# Patient Record
Sex: Female | Born: 1985
Health system: Southern US, Community
[De-identification: ages and names within clinical notes are randomized; demographics above are authoritative.]

## PROBLEM LIST (undated history)

## (undated) DIAGNOSIS — M1990 Unspecified osteoarthritis, unspecified site: Secondary | ICD-10-CM

## (undated) DIAGNOSIS — F329 Major depressive disorder, single episode, unspecified: Secondary | ICD-10-CM

## (undated) DIAGNOSIS — F32A Depression, unspecified: Secondary | ICD-10-CM

## (undated) DIAGNOSIS — R51 Headache: Secondary | ICD-10-CM

## (undated) DIAGNOSIS — F419 Anxiety disorder, unspecified: Secondary | ICD-10-CM

## (undated) DIAGNOSIS — R519 Headache, unspecified: Secondary | ICD-10-CM

## (undated) HISTORY — PX: KNEE SURGERY: SHX244

## (undated) HISTORY — PX: WISDOM TOOTH EXTRACTION: SHX21

---
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
## 2007-02-22 ENCOUNTER — Emergency Department (HOSPITAL_COMMUNITY): Admission: EM | Admit: 2007-02-22 | Discharge: 2007-02-22 | Payer: Self-pay | Admitting: Emergency Medicine

## 2007-06-14 ENCOUNTER — Ambulatory Visit (HOSPITAL_COMMUNITY): Admission: RE | Admit: 2007-06-14 | Discharge: 2007-06-14 | Payer: Self-pay | Admitting: Orthopedic Surgery

## 2011-07-21 ENCOUNTER — Emergency Department (HOSPITAL_COMMUNITY)
Admission: EM | Admit: 2011-07-21 | Discharge: 2011-07-22 | Disposition: A | Discharge status: Home / Self Care | Payer: 59 | Attending: Emergency Medicine | Admitting: Emergency Medicine

## 2011-07-21 ENCOUNTER — Encounter (HOSPITAL_COMMUNITY): Payer: Self-pay | Admitting: *Deleted

## 2011-07-21 DIAGNOSIS — E860 Dehydration: Secondary | ICD-10-CM | POA: Insufficient documentation

## 2011-07-21 DIAGNOSIS — R000 Tachycardia, unspecified: Secondary | ICD-10-CM | POA: Insufficient documentation

## 2011-07-21 DIAGNOSIS — R112 Nausea with vomiting, unspecified: Secondary | ICD-10-CM | POA: Insufficient documentation

## 2011-07-21 DIAGNOSIS — R197 Diarrhea, unspecified: Secondary | ICD-10-CM

## 2011-07-21 MED ORDER — SODIUM CHLORIDE 0.9 % IV BOLUS (SEPSIS)
1000.0000 mL | Freq: Once | INTRAVENOUS | Status: AC
  Administered 2011-07-22: 1000 mL via INTRAVENOUS

## 2011-07-21 MED ORDER — ONDANSETRON HCL 4 MG/2ML IJ SOLN
4.0000 mg | Freq: Once | INTRAMUSCULAR | Status: AC
  Administered 2011-07-22: 4 mg via INTRAVENOUS
  Filled 2011-07-21: qty 2

## 2011-07-21 NOTE — ED Notes (Signed)
Pt states that she has been experiencing N/V/D x 1 day.  Pt also c/o abdominal cramping.

## 2011-07-21 NOTE — ED Provider Notes (Signed)
History     CSN: [id number]  Arrival date & time 07/21/11  2122   First MD Initiated Contact with Patient 07/21/11 2257      Chief Complaint  Patient presents with  . Nausea    (Consider location/radiation/quality/duration/timing/severity/associated sxs/prior treatment) HPI Comments: PT is a 26 y/o female with hx of n/v/d that started today - actue in osnet, intermittent, 6 episodes of watery diarrhea which is non bloody and 3 episodes of vomiting.  Nothing makes better or worse = no associated f/c sob / back pain / cp.  She does have mild abd cramping.  She denies dysuria, has had no medications prior to arrival and does note that she has a sick contact in a sister-in-law who has similar symptoms. She has not been able to tolerate fluids today.  The history is provided by the patient and a relative.    History reviewed. No pertinent past medical history.  History reviewed. No pertinent past surgical history.  No family history on file.  History  Substance Use Topics  . Smoking status: Not on file  . Smokeless tobacco: Not on file  . Alcohol Use: No    OB History    Grav Para Term Preterm Abortions TAB SAB Ect Mult Living                  Review of Systems  All other systems reviewed and are negative.    Allergies  Doxycycline  Home Medications   Current Outpatient Rx  Name Route Sig Dispense Refill  . IMIPRAMINE HCL 25 MG PO TABS Oral Take 25 mg by mouth at bedtime.    Marland Kitchen LEVONORGESTREL 20 MCG/24HR IU IUD Intrauterine 1 each by Intrauterine route once.    . OMEGA-3-ACID ETHYL ESTERS 1 G PO CAPS Oral Take 1 g by mouth daily.    Marland Kitchen PRENATAL MULTIVITAMIN CH Oral Take 1 tablet by mouth daily.    Marland Kitchen ONDANSETRON 4 MG PO TBDP Oral Take 1 tablet (4 mg total) by mouth every 8 (eight) hours as needed for nausea. 10 tablet 0    BP 131/91  Pulse 109  Temp(Src) 98 F (36.7 C) (Oral)  Resp 18  SpO2 99%  LMP 07/14/2011  Physical Exam  Nursing note and vitals  reviewed. Constitutional: She appears well-developed and well-nourished. No distress.  HENT:  Head: Normocephalic and atraumatic.  Mouth/Throat: No oropharyngeal exudate.       MM dry  Eyes: Conjunctivae and EOM are normal. Pupils are equal, round, and reactive to light. Right eye exhibits no discharge. Left eye exhibits no discharge. No scleral icterus.  Neck: Normal range of motion. Neck supple. No JVD present. No thyromegaly present.  Cardiovascular: Regular rhythm, normal heart sounds and intact distal pulses.  Exam reveals no gallop and no friction rub.   No murmur heard.      tachycadia to 110  Pulmonary/Chest: Effort normal and breath sounds normal. No respiratory distress. She has no wheezes. She has no rales.  Abdominal: Soft. Bowel sounds are normal. She exhibits no distension and no mass. There is tenderness (  minimal diffuse abdominal tenderness, no focal tenderness, no masses, no guarding).       Non-peritoneal  Musculoskeletal: Normal range of motion. She exhibits no edema and no tenderness.  Lymphadenopathy:    She has no cervical adenopathy.  Neurological: She is alert. Coordination normal.  Skin: Skin is warm and dry. No rash noted. No erythema.  Psychiatric: She has a normal mood  and affect. Her behavior is normal.    ED Course  Procedures (including critical care time)  Labs Reviewed - No data to display No results found.   1. Nausea vomiting and diarrhea   2. Dehydration       MDM  Dehydrated secondary to gastroenteritis - IVF rescucitation - BP normal, no signs of anemia, or focal ttp concerning for any other processes.  Patient has been given 1 L of IV fluids, Zofran for nausea and has improved significantly currently asking for fluids. Fluids trial home with Zofran prescription.       Vida Roller, MD 07/22/11 [phone]

## 2011-07-22 MED ORDER — ONDANSETRON 4 MG PO TBDP: 4.0000 mg | ORAL_TABLET | Freq: Three times a day (TID) | ORAL | Status: AC | PRN

## 2011-07-22 NOTE — Discharge Instructions (Signed)
zofran for nausea, return as needed - drink plenty of fluids over the next 24 hours.

## 2011-07-22 NOTE — ED Notes (Signed)
Pt. Tolerating coke beverage without feeling any nausea. Denies vomiting.

## 2013-12-25 ENCOUNTER — Inpatient Hospital Stay (HOSPITAL_COMMUNITY)
Admission: AD | Admit: 2013-12-25 | Discharge: 2013-12-25 | Disposition: A | Discharge status: Home / Self Care | Payer: Managed Care, Other (non HMO) | Source: Ambulatory Visit | Attending: Obstetrics & Gynecology | Admitting: Obstetrics & Gynecology

## 2013-12-25 ENCOUNTER — Inpatient Hospital Stay (HOSPITAL_COMMUNITY): Payer: Managed Care, Other (non HMO)

## 2013-12-25 ENCOUNTER — Encounter (HOSPITAL_COMMUNITY): Payer: Self-pay

## 2013-12-25 DIAGNOSIS — O209 Hemorrhage in early pregnancy, unspecified: Secondary | ICD-10-CM | POA: Insufficient documentation

## 2013-12-25 DIAGNOSIS — O360990 Maternal care for other rhesus isoimmunization, unspecified trimester, not applicable or unspecified: Secondary | ICD-10-CM | POA: Diagnosis not present

## 2013-12-25 LAB — CBC
HCT: 38.2 % (ref 36.0–46.0)
Hemoglobin: 13.7 g/dL (ref 12.0–15.0)
MCH: 29.1 pg (ref 26.0–34.0)
MCHC: 35.9 g/dL (ref 30.0–36.0)
MCV: 81.3 fL (ref 78.0–100.0)
PLATELETS: 241 10*3/uL (ref 150–400)
RBC: 4.7 MIL/uL (ref 3.87–5.11)
RDW: 12.2 % (ref 11.5–15.5)
WBC: 6.5 10*3/uL (ref 4.0–10.5)

## 2013-12-25 LAB — WET PREP, GENITAL
Clue Cells Wet Prep HPF POC: NONE SEEN
Trich, Wet Prep: NONE SEEN
Yeast Wet Prep HPF POC: NONE SEEN

## 2013-12-25 LAB — HCG, QUANTITATIVE, PREGNANCY: hCG, Beta Chain, Quant, S: 10793 m[IU]/mL — ABNORMAL HIGH (ref <5)

## 2013-12-25 LAB — ABO/RH: ABO/RH(D): O NEG

## 2013-12-25 LAB — POCT PREGNANCY, URINE: PREG TEST UR: POSITIVE — AB

## 2013-12-25 MED ORDER — RHO D IMMUNE GLOBULIN 1500 UNIT/2ML IJ SOSY
300.0000 ug | PREFILLED_SYRINGE | Freq: Once | INTRAMUSCULAR | Status: AC
  Administered 2013-12-25: 300 ug via INTRAMUSCULAR
  Filled 2013-12-25: qty 2

## 2013-12-25 NOTE — MAU Provider Note (Signed)
Reviewed case with NP and I agree with above.   Teresa Perkins [name]  

## 2013-12-25 NOTE — MAU Provider Note (Signed)
History     CSN: [id number]  Arrival date and time: 12/25/13 1531   First Provider Initiated Contact with Patient 12/25/13 1620      Chief Complaint  Patient presents with  . Vaginal Bleeding  . Abdominal Cramping   HPIpt is 4793w3d pregnant and had dark brown spotting and cramping like a period.    Pt presents complaining of vaginal bleeding like a period and abdominal cramping. States it started last night.      History reviewed. No pertinent past medical history.  Past Surgical History  Procedure Laterality Date  . Knee surgery      History reviewed. No pertinent family history.  History  Substance Use Topics  . Smoking status: Not on file  . Smokeless tobacco: Not on file  . Alcohol Use: No    Allergies:  Allergies  Allergen Reactions  . Doxycycline Nausea And Vomiting         Prescriptions prior to admission  Medication Sig Dispense Refill  . Prenatal Vit-Fe Fumarate-FA (PRENATAL MULTIVITAMIN) TABS Take 1 tablet by mouth daily.        Review of Systems  Constitutional: Negative for fever and chills.  Gastrointestinal: Positive for abdominal pain. Negative for heartburn, nausea and vomiting.  Genitourinary: Negative for dysuria and urgency.   Physical Exam   Blood pressure 122/75, pulse 69, temperature 97.8 F (36.6 C), temperature source Oral, resp. rate 69, last menstrual period 10/20/2013.  Physical Exam  Nursing note and vitals reviewed. Constitutional: She is oriented to person, place, and time. She appears well-developed and well-nourished. No distress.  HENT:  Head: Normocephalic.  Eyes: Pupils are equal, round, and reactive to light.  Neck: Normal range of motion. Neck supple.  Cardiovascular: Normal rate.   Respiratory: Effort normal.  GI: Soft.  Genitourinary:  Small amount of blood in vault; cervix clean NT; uterus NSSC NT; adnexa without palpable enlargement, NT  Musculoskeletal: Normal range of motion.  Neurological: She is alert  and oriented to person, place, and time.  Skin: Skin is warm and dry.  Psychiatric: She has a normal mood and affect.    MAU Course  Procedures Results for orders placed during the hospital encounter of 12/25/13 (from the past 24 hour(s))  POCT PREGNANCY, URINE     Status: Abnormal   Collection Time    12/25/13  4:09 PM      Result Value Ref Range   Preg Test, Ur POSITIVE (*) NEGATIVE  ABO/RH     Status: None   Collection Time    12/25/13  4:35 PM      Result Value Ref Range   ABO/RH(D) O NEG    CBC     Status: None   Collection Time    12/25/13  4:35 PM      Result Value Ref Range   WBC 6.5  4.0 - 10.5 K/uL   RBC 4.70  3.87 - 5.11 MIL/uL   Hemoglobin 13.7  12.0 - 15.0 g/dL   HCT 40.938.2  81.136.0 - 91.446.0 %   MCV 81.3  78.0 - 100.0 fL   MCH 29.1  26.0 - 34.0 pg   MCHC 35.9  30.0 - 36.0 g/dL   RDW 78.212.2  95.611.5 - 21.315.5 %   Platelets 241  150 - 400 K/uL  HCG, QUANTITATIVE, PREGNANCY     Status: Abnormal   Collection Time    12/25/13  4:35 PM      Result Value Ref Range   hCG, Beta  Nyra Jabs, [place] [address] (*) <5 mIU/mL  WET PREP, GENITAL     Status: Abnormal   Collection Time    12/25/13  4:50 PM      Result Value Ref Range   Yeast Wet Prep HPF POC NONE SEEN  NONE SEEN   Trich, Wet Prep NONE SEEN  NONE SEEN   Clue Cells Wet Prep HPF POC NONE SEEN  NONE SEEN   WBC, Wet Prep HPF POC FEW (*) NONE SEEN  US Ob Comp Less 14 Wks  12/25/2013   CLINICAL DATA:  Bleeding, cramping.  EXAM: OBSTETRIC <14 WK ULTRASOUND  TECHNIQUE: Transabdominal ultrasound was performed for evaluation of the gestation as well as the maternal uterus and adnexal regions.  COMPARISON:  None.  FINDINGS: Intrauterine gestational sac: Visualized/ somewhat irregular in shape.  Yolk sac:  Visualized  Embryo:  Soft tissue area noted, questionable fetal pole.  Cardiac Activity: None visualized  Heart Rate:  bpm  MSD: 24  Mm   7 w   4  d  CRL: If present, measures approximately 26 mm 5 w 6 d [place] EDC:  Maternal uterus/adnexae: No  subchorionic hemorrhage. No adnexal masses. Ovaries symmetric in size. No free fluid.  IMPRESSION: Somewhat irregular gestational sac noted. There is questionable early fetal pole present without Cardiac activity at this point. Given the size/date discrepancy between the gestational sac and fetal pole, no EDC was assigned. Recommend followup ultrasound in 10-14 days to assess for change and expected progression.   Electronically Signed   By: Charlett Nose M.D.   On: 12/25/2013 18:09   US Ob Transvaginal  12/25/2013   CLINICAL DATA:  Bleeding, cramping.  EXAM: OBSTETRIC <14 WK ULTRASOUND  TECHNIQUE: Transabdominal ultrasound was performed for evaluation of the gestation as well as the maternal uterus and adnexal regions.  COMPARISON:  None.  FINDINGS: Intrauterine gestational sac: Visualized/ somewhat irregular in shape.  Yolk sac:  Visualized  Embryo:  Soft tissue area noted, questionable fetal pole.  Cardiac Activity: None visualized  Heart Rate:  bpm  MSD: 24  Mm   7 w   4  d  CRL: If present, measures approximately 26 mm 5 w 6 d [place] EDC:  Maternal uterus/adnexae: No subchorionic hemorrhage. No adnexal masses. Ovaries symmetric in size. No free fluid.  IMPRESSION: Somewhat irregular gestational sac noted. There is questionable early fetal pole present without Cardiac activity at this point. Given the size/date discrepancy between the gestational sac and fetal pole, no EDC was assigned. Recommend followup ultrasound in 10-14 days to assess for change and expected progression.   Electronically Signed   By: Charlett Nose M.D.   On: 12/25/2013 18:09  Rhogam given- O neg Discussed with Dr. Mora Appl Assessment and Plan  Bleeding in pregnancy- f/u next week with Dr. Waynard Reeds Rh neg- Rhophylac given  [facility] 12/25/2013, 4:23 PM

## 2013-12-25 NOTE — MAU Note (Signed)
Pt presents complaining of vaginal bleeding like a period and abdominal cramping. States it started last night.

## 2013-12-25 NOTE — MAU Note (Signed)
Pt called out complaining of heavier vaginal bleeding. S.Lineberry at bedside to reassess bleeding

## 2013-12-26 LAB — RH IG WORKUP (INCLUDES ABO/RH)
ABO/RH(D): O NEG
ANTIBODY SCREEN: NEGATIVE
Gestational Age(Wks): 9
UNIT DIVISION: 0

## 2013-12-27 LAB — GC/CHLAMYDIA PROBE AMP
CT PROBE, AMP APTIMA: NEGATIVE
GC PROBE AMP APTIMA: NEGATIVE

## 2014-03-14 ENCOUNTER — Encounter (HOSPITAL_COMMUNITY): Payer: Self-pay

## 2014-10-30 ENCOUNTER — Encounter (HOSPITAL_COMMUNITY): Payer: Self-pay | Admitting: *Deleted

## 2015-05-14 NOTE — L&D Delivery Note (Signed)
Delivery Note Patient pushed for 1 hour after she was noted to be C/C/+2.  At 12:36 AM a viable and healthy female was delivered via Vaginal, Spontaneous Delivery (Presentation: ROA ). Tight nuchal cord x 1. Baby was delivered through nuchal cord.  APGAR: 8/9 weight is pending. Neonatal team was present at time of delivery due to deep variables with pushing and thick meconium.  Placenta was delivered spontaneously, intact with a 3-vessel  Cord.  Complications: none  Cord pH: drawn, pending  Anesthesia:  Epidural  Episiotomy:  none Lacerations: 2nd degree;Perineal Suture Repair: 2.0 vicryl Est. Blood Loss (mL):  300 mL  Mom to postpartum.  Baby to Couplet care / Skin to Skin.  Teresa Perkins [name] 03/02/2016, 1:12 AM

## 2015-08-04 LAB — OB RESULTS CONSOLE GC/CHLAMYDIA
Chlamydia: NEGATIVE
Gonorrhea: NEGATIVE

## 2015-08-28 LAB — OB RESULTS CONSOLE RUBELLA ANTIBODY, IGM: RUBELLA: IMMUNE

## 2015-08-28 LAB — OB RESULTS CONSOLE HEPATITIS B SURFACE ANTIGEN: HEP B S AG: NEGATIVE

## 2015-08-28 LAB — OB RESULTS CONSOLE RPR: RPR: NONREACTIVE

## 2015-08-28 LAB — OB RESULTS CONSOLE HIV ANTIBODY (ROUTINE TESTING): HIV: NONREACTIVE

## 2015-11-13 ENCOUNTER — Inpatient Hospital Stay (HOSPITAL_COMMUNITY)
Admission: AD | Admit: 2015-11-13 | Payer: Managed Care, Other (non HMO) | Source: Ambulatory Visit | Admitting: Obstetrics and Gynecology

## 2016-02-06 IMAGING — US US OB COMP LESS 14 WK
1 series · 14 of 28 positions shown · non-contrast
Comparison: None.

CLINICAL DATA: Bleeding, cramping.

EXAM:
OBSTETRIC <14 WK ULTRASOUND
TECHNIQUE: Transabdominal ultrasound was performed for evaluation of the
gestation as well as the maternal uterus and adnexal regions.

[Series 1: us ob comp less 14 wks · 48 acquisitions, 14 frames shown]
[im 2/48]
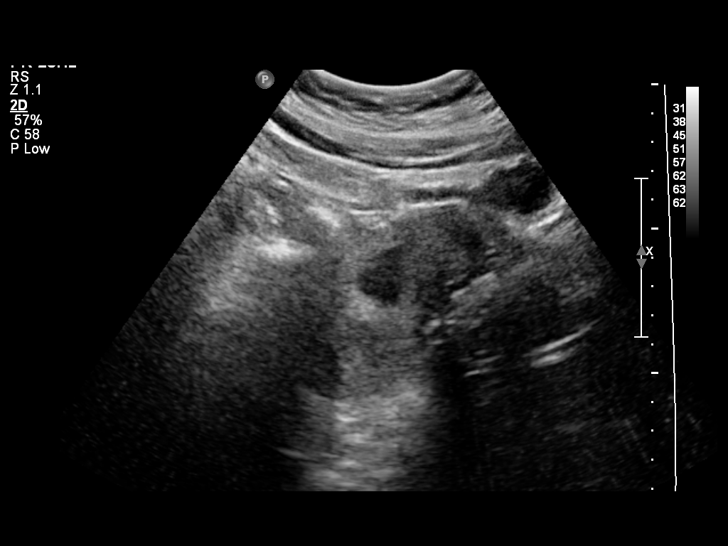
[im 6/48]
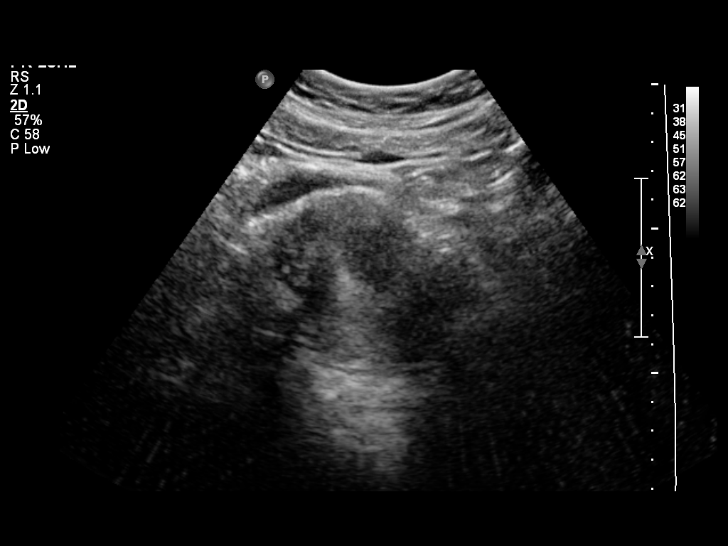
[im 9/48]
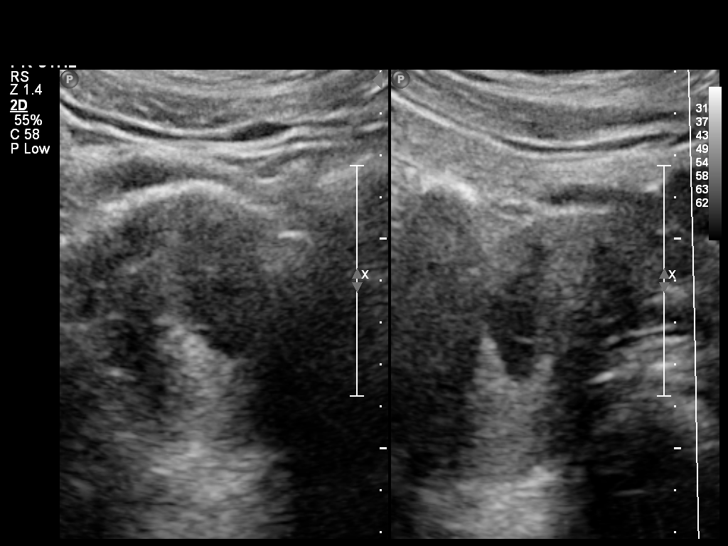
[im 13/48]
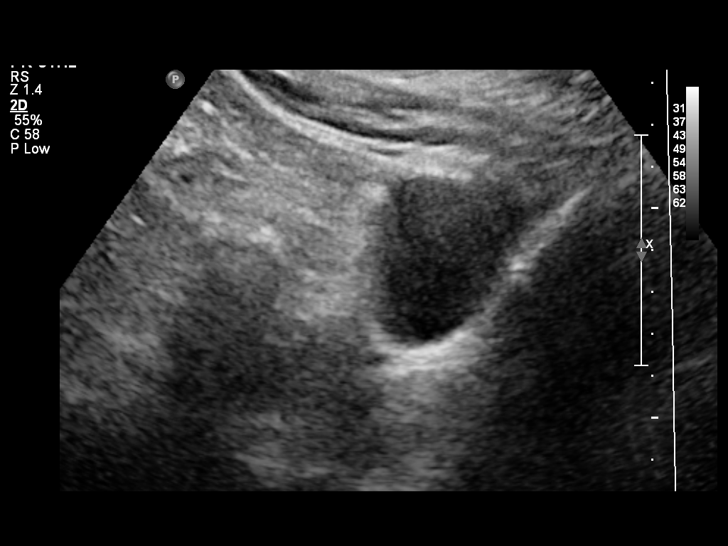
[im 16/48]
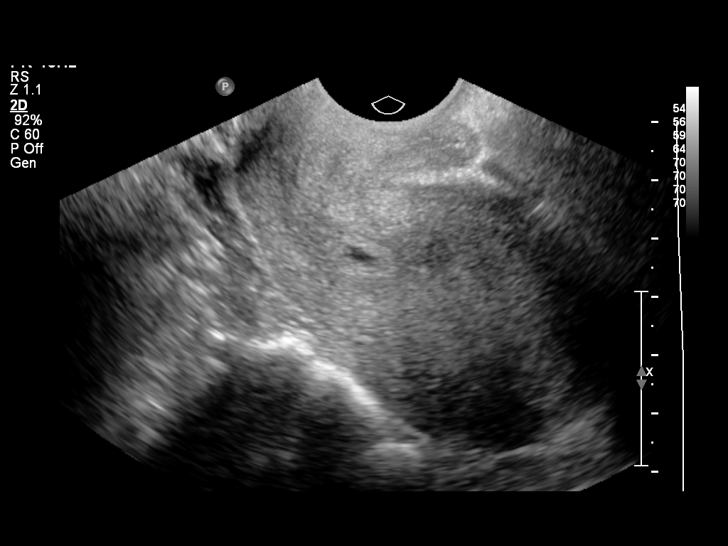
[im 20/48]
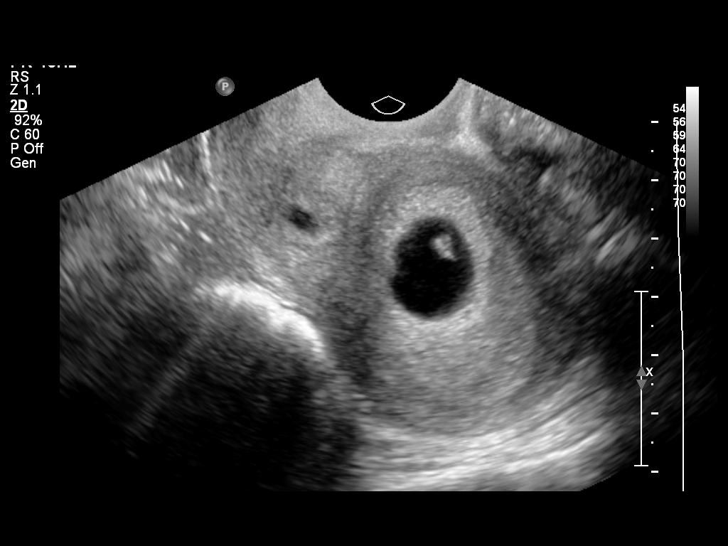
[im 23/48]
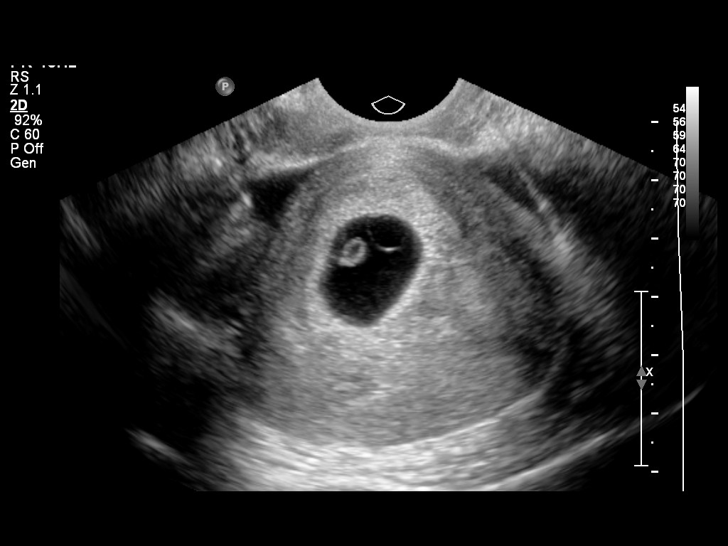
[im 27/48]
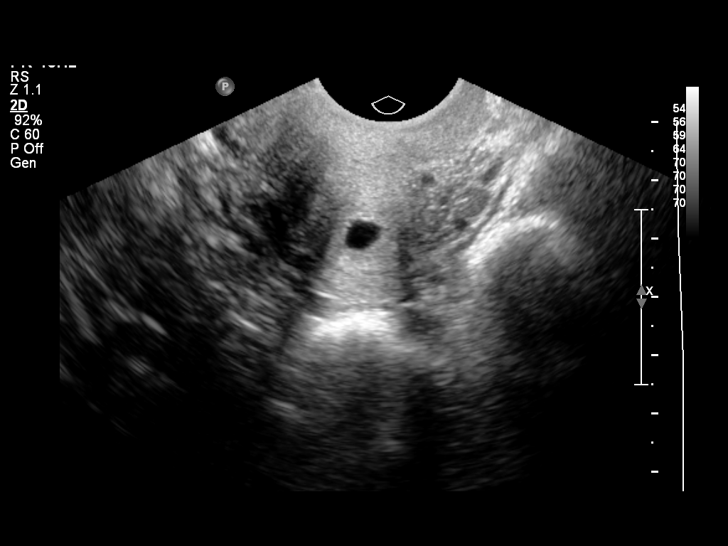
[im 30/48]
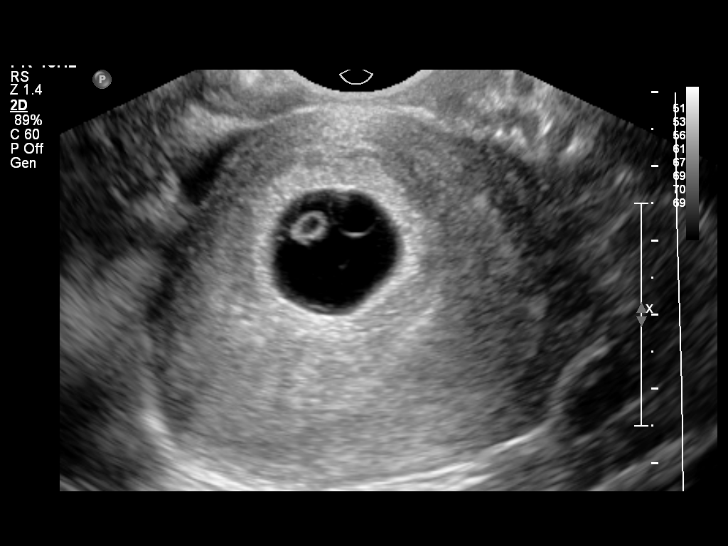
[im 34/48]
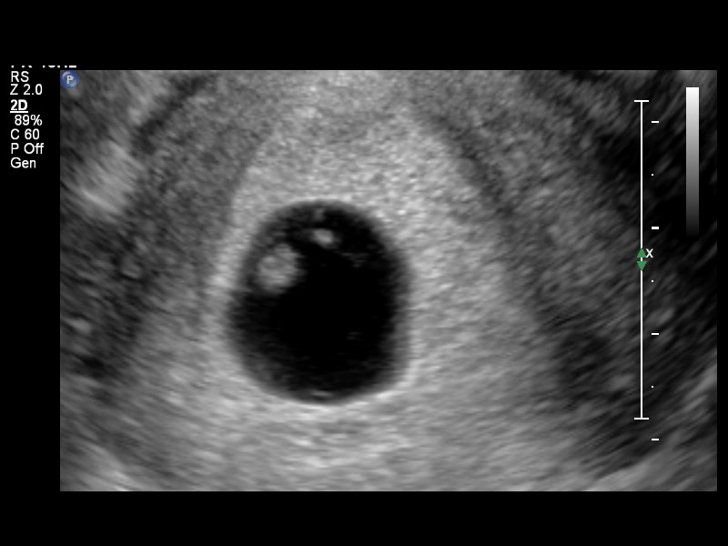
[im 37/48]
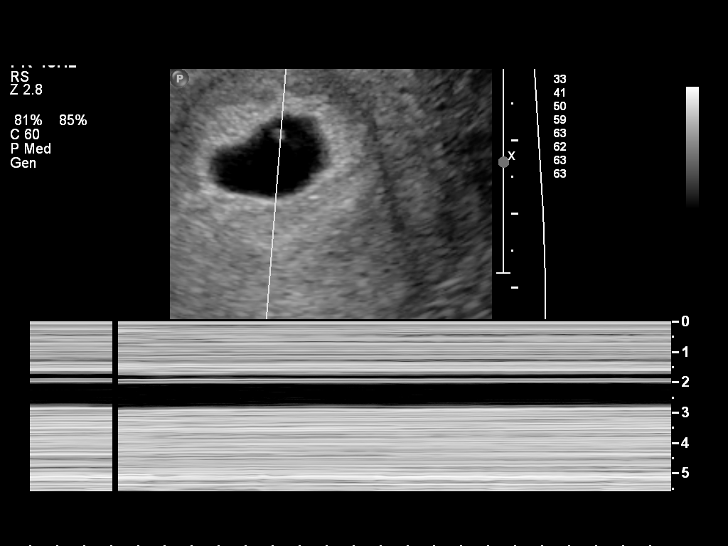
[im 41/48]
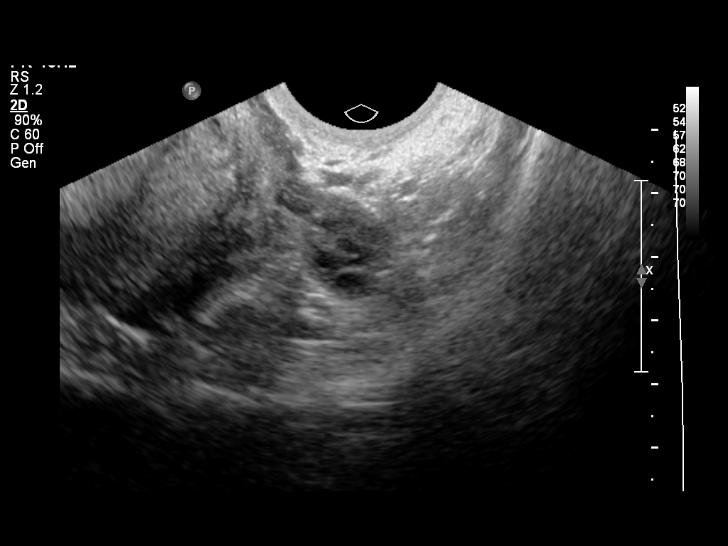
[im 44/48]
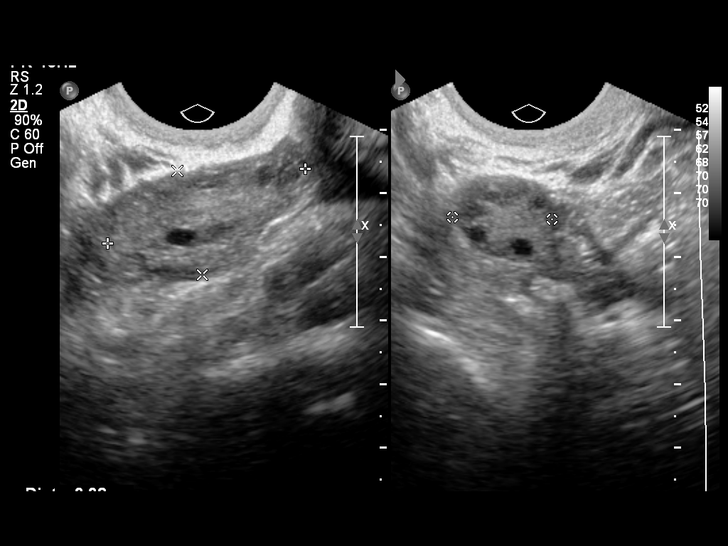
[im 48/48]
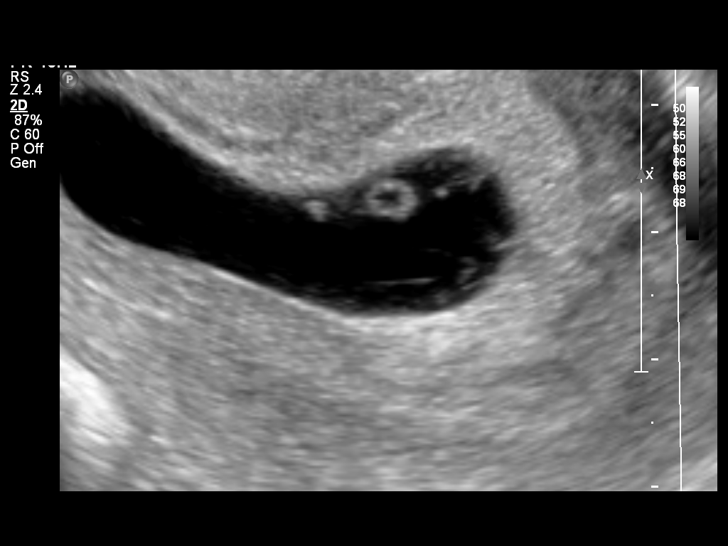

[14 of 28 positions shown; findings below may reference images not displayed]

FINDINGS: Intrauterine gestational sac: Visualized/ somewhat irregular in
shape.

Yolk sac:  Visualized

Embryo:  Soft tissue area noted, questionable fetal pole.

Cardiac Activity: None visualized

Heart Rate:  bpm

MSD: 24  Mm   7 w   4  d

CRL: If present, measures approximately 26 mm 5 w 6 d US EDC:

Maternal uterus/adnexae: No subchorionic hemorrhage. No adnexal
masses. Ovaries symmetric in size. No free fluid.
IMPRESSION: Somewhat irregular gestational sac noted. There is questionable
early fetal pole present without Cardiac activity at this point.
Given the size/date discrepancy between the gestational sac and
fetal pole, no EDC was assigned. Recommend followup ultrasound in
10-14 days to assess for change and expected progression.

## 2016-02-12 LAB — OB RESULTS CONSOLE GBS: GBS: NEGATIVE

## 2016-03-01 ENCOUNTER — Inpatient Hospital Stay (HOSPITAL_COMMUNITY)
Admission: AD | Admit: 2016-03-01 | Discharge: 2016-03-04 | DRG: 775 | Disposition: A | Discharge status: Home / Self Care | Payer: 59 | Source: Ambulatory Visit | Attending: Obstetrics & Gynecology | Admitting: Obstetrics & Gynecology

## 2016-03-01 ENCOUNTER — Encounter (HOSPITAL_COMMUNITY): Payer: Self-pay | Admitting: *Deleted

## 2016-03-01 ENCOUNTER — Inpatient Hospital Stay (HOSPITAL_COMMUNITY): Payer: 59 | Admitting: Anesthesiology

## 2016-03-01 DIAGNOSIS — Z3A38 38 weeks gestation of pregnancy: Secondary | ICD-10-CM

## 2016-03-01 DIAGNOSIS — Z3403 Encounter for supervision of normal first pregnancy, third trimester: Secondary | ICD-10-CM | POA: Diagnosis present

## 2016-03-01 HISTORY — DX: Major depressive disorder, single episode, unspecified: F32.9

## 2016-03-01 HISTORY — DX: Headache: R51

## 2016-03-01 HISTORY — DX: Headache, unspecified: R51.9

## 2016-03-01 HISTORY — DX: Unspecified osteoarthritis, unspecified site: M19.90

## 2016-03-01 HISTORY — DX: Depression, unspecified: F32.A

## 2016-03-01 HISTORY — DX: Anxiety disorder, unspecified: F41.9

## 2016-03-01 LAB — CBC
HEMATOCRIT: 36.4 % (ref 36.0–46.0)
HEMOGLOBIN: 13 g/dL (ref 12.0–15.0)
MCH: 27.7 pg (ref 26.0–34.0)
MCHC: 35.7 g/dL (ref 30.0–36.0)
MCV: 77.4 fL — ABNORMAL LOW (ref 78.0–100.0)
Platelets: 231 10*3/uL (ref 150–400)
RBC: 4.7 MIL/uL (ref 3.87–5.11)
RDW: 12.5 % (ref 11.5–15.5)
WBC: 13.6 10*3/uL — ABNORMAL HIGH (ref 4.0–10.5)

## 2016-03-01 MED ORDER — SOD CITRATE-CITRIC ACID 500-334 MG/5ML PO SOLN: 30.0000 mL | ORAL | Status: DC | PRN

## 2016-03-01 MED ORDER — EPHEDRINE 5 MG/ML INJ: 10.0000 mg | INTRAVENOUS | Status: DC | PRN

## 2016-03-01 MED ORDER — DIPHENHYDRAMINE HCL 50 MG/ML IJ SOLN: 12.5000 mg | INTRAMUSCULAR | Status: DC | PRN

## 2016-03-01 MED ORDER — ACETAMINOPHEN 325 MG PO TABS: 650.0000 mg | ORAL_TABLET | ORAL | Status: DC | PRN

## 2016-03-01 MED ORDER — OXYCODONE-ACETAMINOPHEN 5-325 MG PO TABS
1.0000 | ORAL_TABLET | ORAL | Status: DC | PRN
  Administered 2016-03-02 (×2): 1 via ORAL

## 2016-03-01 MED ORDER — LACTATED RINGERS IV SOLN
500.0000 mL | Freq: Once | INTRAVENOUS | Status: AC
  Administered 2016-03-01: 500 mL via INTRAVENOUS

## 2016-03-01 MED ORDER — OXYTOCIN 40 UNITS IN LACTATED RINGERS INFUSION - SIMPLE MED
2.5000 [IU]/h | INTRAVENOUS | Status: DC
  Filled 2016-03-01: qty 1000

## 2016-03-01 MED ORDER — FENTANYL 2.5 MCG/ML BUPIVACAINE 1/10 % EPIDURAL INFUSION (WH - ANES)
14.0000 mL/h | INTRAMUSCULAR | Status: DC | PRN
  Administered 2016-03-01: 14 mL/h via EPIDURAL
  Filled 2016-03-01: qty 125

## 2016-03-01 MED ORDER — PHENYLEPHRINE 40 MCG/ML (10ML) SYRINGE FOR IV PUSH (FOR BLOOD PRESSURE SUPPORT)
80.0000 ug | PREFILLED_SYRINGE | INTRAVENOUS | Status: DC | PRN
  Administered 2016-03-01 (×2): 80 ug via INTRAVENOUS
  Filled 2016-03-01: qty 10

## 2016-03-01 MED ORDER — LACTATED RINGERS IV SOLN: 500.0000 mL | INTRAVENOUS | Status: DC | PRN

## 2016-03-01 MED ORDER — LIDOCAINE HCL (PF) 1 % IJ SOLN
INTRAMUSCULAR | Status: DC | PRN
  Administered 2016-03-01 (×2): 5 mL

## 2016-03-01 MED ORDER — FLEET ENEMA 7-19 GM/118ML RE ENEM: 1.0000 | ENEMA | RECTAL | Status: DC | PRN

## 2016-03-01 MED ORDER — ONDANSETRON HCL 4 MG/2ML IJ SOLN
4.0000 mg | Freq: Four times a day (QID) | INTRAMUSCULAR | Status: DC | PRN
  Administered 2016-03-01: 4 mg via INTRAVENOUS
  Filled 2016-03-01: qty 2

## 2016-03-01 MED ORDER — OXYCODONE-ACETAMINOPHEN 5-325 MG PO TABS: 2.0000 | ORAL_TABLET | ORAL | Status: DC | PRN

## 2016-03-01 MED ORDER — LACTATED RINGERS IV SOLN
INTRAVENOUS | Status: DC
  Administered 2016-03-02: via INTRAVENOUS

## 2016-03-01 MED ORDER — OXYTOCIN BOLUS FROM INFUSION
500.0000 mL | Freq: Once | INTRAVENOUS | Status: AC
  Administered 2016-03-02: 500 mL via INTRAVENOUS

## 2016-03-01 MED ORDER — LIDOCAINE HCL (PF) 1 % IJ SOLN
30.0000 mL | INTRAMUSCULAR | Status: DC | PRN
  Filled 2016-03-01: qty 30

## 2016-03-01 MED ORDER — PHENYLEPHRINE 40 MCG/ML (10ML) SYRINGE FOR IV PUSH (FOR BLOOD PRESSURE SUPPORT)
80.0000 ug | PREFILLED_SYRINGE | INTRAVENOUS | Status: DC | PRN
  Filled 2016-03-01: qty 10

## 2016-03-01 NOTE — Anesthesia Procedure Notes (Signed)
Epidural Patient location during procedure: OB  Staffing Anesthesiologist: Noah Lembke Performed: anesthesiologist   Preanesthetic Checklist Completed: patient identified, site marked, surgical consent, pre-op evaluation, timeout performed, IV checked, risks and benefits discussed and monitors and equipment checked  Epidural Patient position: sitting Prep: DuraPrep Patient monitoring: heart rate, continuous pulse ox and blood pressure Approach: midline Location: L3-L4 Injection technique: LOR saline  Needle:  Needle type: Tuohy  Needle gauge: 17 G Needle length: 9 cm and 9 Needle insertion depth: 6 cm Catheter type: closed end flexible Catheter size: 20 Guage Catheter at skin depth: 10 cm Test dose: negative  Assessment Events: blood not aspirated, injection not painful, no injection resistance, negative IV test and no paresthesia  Additional Notes Patient identified. Risks/Benefits/Options discussed with patient including but not limited to bleeding, infection, nerve damage, paralysis, failed block, incomplete pain control, headache, blood pressure changes, nausea, vomiting, reactions to medication both or allergic, itching and postpartum back pain. Confirmed with bedside nurse the patient's most recent platelet count. Confirmed with patient that they are not currently taking any anticoagulation, have any bleeding history or any family history of bleeding disorders. Patient expressed understanding and wished to proceed. All questions were answered. Sterile technique was used throughout the entire procedure. Please see nursing notes for vital signs. Test dose was given through epidural needle and negative prior to continuing to dose epidural or start infusion. Warning signs of high block given to the patient including shortness of breath, tingling/numbness in hands, complete motor block, or any concerning symptoms with instructions to call for help. Patient was given instructions  on fall risk and not to get out of bed. All questions and concerns addressed with instructions to call with any issues.     

## 2016-03-01 NOTE — H&P (Signed)
Teresa Perkins is a 30 y.o. female presenting for regular painful contractions. Patient denies leaking of fluid, +bloody show, normal fetal movement.   OB History    Gravida Para Term Preterm AB Living   2       1     SAB TAB Ectopic Multiple Live Births   1             Past Medical History:  Diagnosis Date  . Anxiety   . Arthritis   . Depression   . Headache    Past Surgical History:  Procedure Laterality Date  . KNEE SURGERY    . WISDOM TOOTH EXTRACTION     Family History: family history is not on file. Social History:  reports that she has never smoked. She has never used smokeless tobacco. She reports that she does not drink alcohol or use drugs.     Maternal Diabetes: No Genetic Screening: Normal Maternal Ultrasounds/Referrals: Normal Fetal Ultrasounds or other Referrals:  Other: Anatomy scan normal Maternal Substance Abuse:  No Significant Maternal Medications:  None Significant Maternal Lab Results:  Lab values include: Group B Strep negative Other Comments:  None  Review of Systems  Constitutional: Negative for chills and fever.  Eyes: Negative for blurred vision and double vision.  Cardiovascular: Negative for chest pain.  Gastrointestinal: Negative for heartburn and nausea.  Genitourinary: Negative for dysuria and urgency.  Neurological: Negative for headaches.   Maternal Medical History:  Reason for admission: Contractions.  Nausea.  Contractions: Onset was 3-5 hours ago.   Frequency: regular.   Duration is approximately 60 seconds.   Perceived severity is strong.    Fetal activity: Perceived fetal activity is normal.   Last perceived fetal movement was within the past 12 hours.    Prenatal complications: no prenatal complications Prenatal Complications - Diabetes: none.    Dilation: 7 Effacement (%): 90 Station: -1 Exam by:: BENJI, RN  Blood pressure 124/83, pulse 86, temperature 97.6 F (36.4 C), resp. rate 16, height 5\' 5"  (1.651 m),  weight 93 kg (205 lb), SpO2 100 %, unknown if currently breastfeeding. Maternal Exam:  Uterine Assessment: Contraction strength is firm.  Contraction duration is 60 seconds. Contraction frequency is regular.   Abdomen: Patient reports no abdominal tenderness. Fundal height is 38 cm.   Estimated fetal weight is 2800 grams.   Fetal presentation: vertex  Introitus: Normal vulva. Normal vagina.  Ferning test: not done.  Nitrazine test: not done. Amniotic fluid character: not assessed.  Pelvis: adequate for delivery.   Cervix: Cervix evaluated by digital exam.   On admission exam done in MAU: 7/90/-1  Fetal Exam Fetal Monitor Review: Baseline rate: 145.  Variability: moderate (6-25 bpm).   Pattern: accelerations present and early decelerations.    Fetal State Assessment: Category I - tracings are normal.     Physical Exam  Vitals reviewed. Constitutional: She appears well-developed and well-nourished.    Prenatal labs: ABO, Rh:   Antibody:   Rubella: Immune (04/17 0000) RPR: Nonreactive (04/17 0000)  HBsAg: Negative (04/17 0000)  HIV: Non-reactive (04/17 0000)  GBS: Negative (10/02 0000)   Assessment/Plan: 30 yo G2P0010 at 38 weeks 1 day in active labor Admit to L&D Continuous monitoring Epidural AROM once patient is comfortable   Belton Peplinski [name] 03/01/2016, 10:09 PM

## 2016-03-01 NOTE — MAU Note (Addendum)
PT  SAYS  SHE  FEELS  STRONG       UC  SINCE 645PM.      VE IN OFFICE    1 CM YESTERDAY.    DENIES HSV .  HAD MRSA-  IN 2007- HAD SORE ON RIGHT  KNEE  GBS- NEG

## 2016-03-01 NOTE — MAU Note (Signed)
Contractions since yesterday afternoon. Stronger today since 1300. Denies LOF or bleeding. 1cm yesterday

## 2016-03-01 NOTE — Anesthesia Preprocedure Evaluation (Signed)

## 2016-03-02 ENCOUNTER — Encounter (HOSPITAL_COMMUNITY): Payer: Self-pay

## 2016-03-02 LAB — CBC
HCT: 30.6 % — ABNORMAL LOW (ref 36.0–46.0)
Hemoglobin: 10.8 g/dL — ABNORMAL LOW (ref 12.0–15.0)
MCH: 27.7 pg (ref 26.0–34.0)
MCHC: 35.3 g/dL (ref 30.0–36.0)
MCV: 78.5 fL (ref 78.0–100.0)
PLATELETS: 199 10*3/uL (ref 150–400)
RBC: 3.9 MIL/uL (ref 3.87–5.11)
RDW: 12.7 % (ref 11.5–15.5)
WBC: 16.9 10*3/uL — ABNORMAL HIGH (ref 4.0–10.5)

## 2016-03-02 LAB — RPR: RPR: NONREACTIVE

## 2016-03-02 MED ORDER — RHO D IMMUNE GLOBULIN 1500 UNIT/2ML IJ SOSY
300.0000 ug | PREFILLED_SYRINGE | Freq: Once | INTRAMUSCULAR | Status: AC
  Administered 2016-03-02: 300 ug via INTRAVENOUS
  Filled 2016-03-02: qty 2

## 2016-03-02 MED ORDER — ONDANSETRON HCL 4 MG PO TABS: 4.0000 mg | ORAL_TABLET | ORAL | Status: DC | PRN

## 2016-03-02 MED ORDER — ACETAMINOPHEN 325 MG PO TABS: 650.0000 mg | ORAL_TABLET | ORAL | Status: DC | PRN

## 2016-03-02 MED ORDER — DIBUCAINE 1 % RE OINT: 1.0000 "application " | TOPICAL_OINTMENT | RECTAL | Status: DC | PRN

## 2016-03-02 MED ORDER — WITCH HAZEL-GLYCERIN EX PADS
1.0000 "application " | MEDICATED_PAD | CUTANEOUS | Status: DC | PRN
  Administered 2016-03-02: 1 via TOPICAL

## 2016-03-02 MED ORDER — PRENATAL MULTIVITAMIN CH
1.0000 | ORAL_TABLET | Freq: Every day | ORAL | Status: DC
  Administered 2016-03-02 – 2016-03-03 (×2): 1 via ORAL
  Filled 2016-03-02 (×2): qty 1

## 2016-03-02 MED ORDER — BENZOCAINE-MENTHOL 20-0.5 % EX AERO: 1.0000 "application " | INHALATION_SPRAY | CUTANEOUS | Status: DC | PRN

## 2016-03-02 MED ORDER — OXYTOCIN 40 UNITS IN LACTATED RINGERS INFUSION - SIMPLE MED: 2.5000 [IU]/h | INTRAVENOUS | Status: DC | PRN

## 2016-03-02 MED ORDER — OXYCODONE-ACETAMINOPHEN 5-325 MG PO TABS
1.0000 | ORAL_TABLET | ORAL | Status: DC | PRN
  Filled 2016-03-02 (×2): qty 1

## 2016-03-02 MED ORDER — SIMETHICONE 80 MG PO CHEW
80.0000 mg | CHEWABLE_TABLET | ORAL | Status: DC | PRN
Start: 2016-03-02 — End: 2016-03-04

## 2016-03-02 MED ORDER — RHO D IMMUNE GLOBULIN 1500 UNIT/2ML IJ SOSY
300.0000 ug | PREFILLED_SYRINGE | Freq: Once | INTRAMUSCULAR | Status: DC
  Filled 2016-03-02: qty 2

## 2016-03-02 MED ORDER — IBUPROFEN 600 MG PO TABS
600.0000 mg | ORAL_TABLET | Freq: Four times a day (QID) | ORAL | Status: DC
  Administered 2016-03-02 – 2016-03-04 (×9): 600 mg via ORAL
  Filled 2016-03-02 (×9): qty 1

## 2016-03-02 MED ORDER — ONDANSETRON HCL 4 MG/2ML IJ SOLN: 4.0000 mg | INTRAMUSCULAR | Status: DC | PRN

## 2016-03-02 MED ORDER — OXYCODONE-ACETAMINOPHEN 5-325 MG PO TABS: 2.0000 | ORAL_TABLET | ORAL | Status: DC | PRN

## 2016-03-02 MED ORDER — DIPHENHYDRAMINE HCL 25 MG PO CAPS: 25.0000 mg | ORAL_CAPSULE | Freq: Four times a day (QID) | ORAL | Status: DC | PRN

## 2016-03-02 MED ORDER — ZOLPIDEM TARTRATE 5 MG PO TABS: 5.0000 mg | ORAL_TABLET | Freq: Every evening | ORAL | Status: DC | PRN

## 2016-03-02 MED ORDER — SENNOSIDES-DOCUSATE SODIUM 8.6-50 MG PO TABS
2.0000 | ORAL_TABLET | ORAL | Status: DC
  Administered 2016-03-03 – 2016-03-04 (×2): 2 via ORAL
  Filled 2016-03-02 (×2): qty 2

## 2016-03-02 MED ORDER — COCONUT OIL OIL
1.0000 "application " | TOPICAL_OIL | Status: DC | PRN
  Filled 2016-03-02: qty 120

## 2016-03-02 NOTE — Lactation Note (Signed)
This note was copied from a baby's chart. Lactation Consultation Note  Patient Name: Teresa Jadene PieriniMegan Dobbin [id number]'s Date: 03/02/2016 Reason for consult: Initial assessment Infant is 12 hours old & seen by [facility] for initial assessment. Baby was born at 5038w2d & weighed 6lbs 1oz at birth. Mom had visitors but stated ok for lactation to come in. Mom reports BF is going well but that baby was not opening very wide earlier so caused some bruising on her breasts but that now it is comfortable. Provided BF booklet, BF resources, & feeding log; mom made aware of O/P services, breastfeeding support groups, community resources, and our phone # for post-discharge questions. Mom encouraged to feed baby 8-12 times/24 hours and with feeding cues. Mom reports she is getting a Medela pump-n-style from insurance soon but that she works from home so will be able to continue BF. Mom reports she is comfortable hand expressing. Mom reports no questions at this time. Encouraged mom to ask for lactation at a future feed to assess latch.  Maternal Data Has patient been taught Hand Expression?: Yes (per pt, has done so before BF)  Feeding Feeding Type: Breast Fed Length of feed: 40 min  LATCH Score/Interventions                      Lactation Tools Discussed/Used WIC Program: No   Consult Status Consult Status: Follow-up Date: 03/03/16 Follow-up type: In-patient    Oneal GroutLaura C Deliana Avalos 03/02/2016, 1:40 PM

## 2016-03-02 NOTE — Anesthesia Postprocedure Evaluation (Signed)
Anesthesia Post Note  Patient: Teresa GalasMegan E Perkins  Procedure(s) Performed: * No procedures listed *  Patient location during evaluation: Mother Baby Anesthesia Type: Epidural Level of consciousness: awake, awake and alert, oriented and patient cooperative Pain management: pain level controlled Vital Signs Assessment: post-procedure vital signs reviewed and stable Respiratory status: spontaneous breathing, nonlabored ventilation and respiratory function stable Cardiovascular status: stable Postop Assessment: patient able to bend at knees, no headache, no backache and no signs of nausea or vomiting Anesthetic complications: no     Last Vitals:  Vitals:   03/02/16 0300 03/02/16 0400  BP: 128/71 130/71  Pulse: 71 76  Resp: 18 18  Temp: 36.4 C 36.6 C    Last Pain:  Vitals:   03/02/16 0400  TempSrc: Oral  PainSc: 0-No pain   Pain Goal: Patients Stated Pain Goal: 0 (03/01/16 2051)               Lyndsy Gilberto L

## 2016-03-02 NOTE — Progress Notes (Signed)
Post Partum Day 0-1 Subjective: no complaints, up ad lib, voiding, tolerating PO and breast feeding  Objective: Blood pressure 130/71, pulse 76, temperature 97.9 F (36.6 C), temperature source Oral, resp. rate 18, height 5\' 5"  (1.651 m), weight 93 kg (205 lb), SpO2 100 %, unknown if currently breastfeeding.  Physical Exam:  General: alert, cooperative and no distress Lochia: appropriate Uterine Fundus: firm Perineum: swelling, intact DVT Evaluation: No evidence of DVT seen on physical exam. Negative Homan's sign. No cords or calf tenderness. Calf/Ankle edema is present.   Recent Labs  03/01/16 2110 03/02/16 0555  HGB 13.0 10.8*  HCT 36.4 30.6*    Assessment/Plan: Plan for discharge tomorrow, Breastfeeding and Circumcision prior to discharge   LOS: 1 day   Teresa Perkins Teresa Perkins 03/02/2016, 11:13 AM

## 2016-03-02 NOTE — Progress Notes (Signed)
Teresa GalasMegan E Perkins is a 30 y.o. G2P0010 at 6846w2d by LMP admitted for active labor  Subjective: Patient feels rectal pressure urge to push  Objective: BP (!) 130/99   Pulse 97   Temp 98.2 F (36.8 C) (Oral)   Resp 16   Ht 5\' 5"  (1.651 m)   Wt 93 kg (205 lb)   SpO2 100%   BMI 34.11 kg/m  No intake/output data recorded. Total I/O In: -  Out: 150 [Urine:150]  FHT:  FHR: 135 bpm, variability: moderate,  accelerations:  Present,  decelerations:  Present variable decels nadir 100's lasting 30 seconds with return to baseline UC:   regular, every 2 minutes SVE:   Dilation: 10 Effacement (%): 100 Station: +1 Exam by:: Alfonzo BeersMolly Trott, RN    Amniotic fluid - thick mec  Labs: Lab Results  Component Value Date   WBC 13.6 (H) 03/01/2016   HGB 13.0 03/01/2016   HCT 36.4 03/01/2016   MCV 77.4 (L) 03/01/2016   PLT 231 03/01/2016    Assessment / Plan: Spontaneous labor, progressing normally, now in Stage II  Labor: Progressing normally Preeclampsia:  no signs or symptoms of toxicity Fetal Wellbeing:  Category II Pain Control:  Epidural I/D:  n/a Anticipated MOD:  NSVD  Teresa Perkins Teresa Perkins 03/02/2016, 12:16 AM

## 2016-03-02 NOTE — Lactation Note (Signed)
This note was copied from a baby's chart. Lactation Consultation Note: mother paged for LC to come to room . Assist mother with latching  infant on in football hold. Infant on and off with a few sucks. After a while infant was able to grasp the breast. Observed frequent suckles and swallows. Infant spit up large amt of white mucus.  Patient Name: Teresa Perkins Record ZOXWR'UToday's Date: 03/02/2016 Reason for consult: Follow-up assessment   Maternal Data Has patient been taught Hand Expression?: Yes (per pt, has done so before BF)  Feeding Feeding Type: Breast Fed Length of feed: 40 min  LATCH Score/Interventions Latch: Repeated attempts needed to sustain latch, nipple held in mouth throughout feeding, stimulation needed to elicit sucking reflex. Intervention(s): Adjust position;Assist with latch;Breast compression  Audible Swallowing: A few with stimulation  Type of Nipple: Flat Intervention(s): Hand pump  Comfort (Breast/Nipple): Filling, red/small blisters or bruises, mild/mod discomfort  Problem noted: Cracked, bleeding, blisters, bruises Interventions  (Cracked/bleeding/bruising/blister): Hand pump  Hold (Positioning): Assistance needed to correctly position infant at breast and maintain latch. Intervention(s): Support Pillows;Position options  LATCH Score: 5  Lactation Tools Discussed/Used WIC Program: No   Consult Status Consult Status: Follow-up Date: 03/03/16 Follow-up type: In-patient    Teresa Perkins, Teresa Perkins [facility] 03/02/2016, 2:37 PM

## 2016-03-03 LAB — RH IG WORKUP (INCLUDES ABO/RH)
ABO/RH(D): O NEG
Fetal Screen: NEGATIVE
GESTATIONAL AGE(WKS): 38.2
Unit division: 0

## 2016-03-03 MED ORDER — OXYCODONE-ACETAMINOPHEN 5-325 MG PO TABS: 1.0000 | ORAL_TABLET | ORAL | 0 refills | Status: DC | PRN

## 2016-03-03 MED ORDER — IBUPROFEN 600 MG PO TABS: 600.0000 mg | ORAL_TABLET | Freq: Four times a day (QID) | ORAL | 3 refills | Status: DC | PRN

## 2016-03-03 NOTE — Progress Notes (Signed)
Post Partum Day 1-2 Subjective: no complaints, up ad lib, voiding, tolerating PO, + flatus and breast feeding  Objective: Blood pressure 105/64, pulse 68, temperature 97.6 F (36.4 C), temperature source Oral, resp. rate 16, height 5\' 5"  (1.651 m), weight 93 kg (205 lb), SpO2 100 %, unknown if currently breastfeeding.  Physical Exam:  General: alert, cooperative and no distress Lochia: appropriate Uterine Fundus: firm perineum: healing well, no significant drainage, no dehiscence, no significant erythema DVT Evaluation: No evidence of DVT seen on physical exam. Negative Homan's sign. No cords or calf tenderness. No significant calf/ankle edema.   Recent Labs  03/01/16 2110 03/02/16 0555  HGB 13.0 10.8*  HCT 36.4 30.6*    Assessment/Plan: Patient unsure if she wants to go home today Will prepare discharge orders and summary and will hold if she changes her mind About going home.  Circumcision done.  She will f/u in office for routine PP visit at 4 weeks.    LOS: 2 days   Teresa Perkins [name] 03/03/2016, 10:52 AM

## 2016-03-03 NOTE — Discharge Summary (Signed)
Obstetric Discharge Summary Reason for Admission: onset of labor Prenatal Procedures: ultrasound Intrapartum Procedures: spontaneous vaginal delivery Postpartum Procedures: none Complications-Operative and Postpartum: none Hemoglobin  Date Value Ref Range Status  03/02/2016 10.8 (L) 12.0 - 15.0 g/dL Final   HCT  Date Value Ref Range Status  03/02/2016 30.6 (L) 36.0 - 46.0 % Final    Physical Exam:  General: alert, cooperative and no distress Lochia: appropriate Uterine Fundus: firm perineum: healing well, no significant drainage, no dehiscence, no significant erythema DVT Evaluation: No evidence of DVT seen on physical exam. Negative Homan's sign. No cords or calf tenderness. No significant calf/ankle edema.  Discharge Diagnoses: Term Pregnancy-delivered  Discharge Information: Date: 03/03/2016 Activity: pelvic rest Diet: routine Medications: PNV, Ibuprofen and Percocet Condition: stable Instructions: refer to practice specific booklet Discharge to: home   Newborn Data: Live born female  Birth Weight: 6 lb 7 oz (2920 g) APGAR: 8, 9  Home with mother.  Essie HartINN, Ahmyah Gidley [name] 03/03/2016, 10:56 AM

## 2016-03-03 NOTE — Lactation Note (Signed)
This note was copied from a baby's chart. Lactation Consultation Note: Mother states that infant is breastfeeding better. Mother ask if it is normal for infant to favor one side over the other. Encouraged mother to continue to offer each breast and infant will begin to take the alternate breast. Mother very happy with infants feedings.   Patient Name: Boy Jadene PieriniMegan Soltys ZOXWR'UToday's Date: 03/03/2016     Maternal Data    Feeding    LATCH Score/Interventions                      Lactation Tools Discussed/Used     Consult Status      Michel BickersKendrick, Issac Moure McCoy 03/03/2016, 3:16 PM

## 2016-03-03 NOTE — Clinical Social Work Maternal (Signed)
  CLINICAL SOCIAL WORK MATERNAL/CHILD NOTE  Patient Details  Name: Teresa Perkins MRN: [id number] Date of Birth: 03/02/2015  Date:  03/03/2016  Clinical Social Worker Initiating Note:  Yer Olivencia, MSW, LCSW-A  Date/ Time Initiated:  03/03/16/1402     Child's Name:  Teresa Perkins Patient    Legal Guardian:  Other (Comment) (Not established by the court system; MOB and FOB parent collectively )   Need for Interpreter:  None   Date of Referral:  03/03/16     Reason for Referral:  Other (Comment) (MOB hx of anxiety and depression )   Referral Source:  Physician   Address:  [address] [place], [place] [address]  Phone number:  [phone]   Household Members:  Self, Spouse   Natural Supports (not living in the home):  Immediate Family, Friends, Parent   Professional Supports: None   Employment: Full-time   Type of Work:     Education:  College graduate   Financial Resources:  Private Insurance   Other Resources:      Cultural/Religious Considerations Which May Impact Care:  None reported at this time.   Strengths:  Ability to meet basic needs , Pediatrician chosen , Compliance with medical plan    Risk Factors/Current Problems:      Cognitive State:  Alert , Goal Oriented , Insightful    Mood/Affect:  Happy , Comfortable , Interested    CSW Assessment: CSW met with MOB at bedside to complete assessment. At this time, MOB was welcoming and bright. FOB was present in the room at the time. This writer explained reasoning for visit being due to MOB hx of anxiety/depression. At this time MOB notes she is doing well. She notes she is excited to potentially go home this evening. This writer reviewed PPD and SIDS with MOB and FOB. Both verbalize understanding. At this time, MOB and FOB noted there are no further needs at this time. CSW has no further concerns, thus case closed to this CSW.   CSW Plan/Description:  No Further Intervention Required/No Barriers to  Discharge    Teresa Perkins, MSW, LCSW-A Clinical Social Worker  [facility]  Office: [phone]   

## 2016-03-04 NOTE — Progress Notes (Signed)
Post Partum Day 2  Subjective: no complaints, up ad lib, voiding, tolerating PO, + flatus and breast feeding  Improved comfort with breastfeeding  Objective: Blood pressure 123/69, pulse (!) 56, temperature 98.1 F (36.7 C), temperature source Oral, resp. rate 16, height 5\' 5"  (1.651 m), weight 93 kg (205 lb), SpO2 100 %, unknown if currently breastfeeding.  Physical Exam:  General: alert, cooperative and no distress Lochia: appropriate Uterine Fundus: firm perineum: healing well, no significant drainage, no dehiscence, no significant erythema DVT Evaluation: No evidence of DVT seen on physical exam. Negative Homan's sign. No cords or calf tenderness. No significant calf/ankle edema.   Recent Labs  03/01/16 2110 03/02/16 0555  HGB 13.0 10.8*  HCT 36.4 30.6*    Assessment/Plan: G2P1011 PPD#2 s/p TSVD Meeting all goals--d/c today   LOS: 3 days   [facility] GEFFEL Bryce Kimble 03/04/2016, 8:32 AM

## 2016-03-04 NOTE — Lactation Note (Signed)
This note was copied from a baby's chart. Lactation Consultation Note  Patient Name: Teresa Jadene PieriniMegan Matsushita BJYNW'GToday's Date: 03/04/2016 Reason for consult: Follow-up assessment Mom latched baby independently. LC un-tucked lower lip for more depth. Mom denies any discomfort with baby at breast. Mom has some aerola bruising from previous shallow latches. Basic teaching reviewed with parents. Engorgement care reviewed if needed. Advised to monitor voids/stools. Advised of OP services and support group. Mom to call for questions/concerns.  Maternal Data    Feeding Feeding Type: Breast Fed Length of feed: 13 min  LATCH Score/Interventions Latch: Grasps breast easily, tongue down, lips flanged, rhythmical sucking. Intervention(s): Breast massage  Audible Swallowing: A few with stimulation  Type of Nipple: Everted at rest and after stimulation  Comfort (Breast/Nipple): Soft / non-tender  Problem noted: Cracked, bleeding, blisters, bruises  Hold (Positioning): No assistance needed to correctly position infant at breast. Intervention(s): Breastfeeding basics reviewed;Support Pillows;Position options;Skin to skin  LATCH Score: 9  Lactation Tools Discussed/Used Tools: Pump Breast pump type: Manual   Consult Status Consult Status: Complete Date: 03/04/16 Follow-up type: In-patient    Alfred LevinsGranger, Michaline Kindig Ann 03/04/2016, 11:18 AM

## 2016-03-05 LAB — TYPE AND SCREEN
ABO/RH(D): O NEG
ANTIBODY SCREEN: POSITIVE
DAT, IGG: NEGATIVE
Unit division: 0
Unit division: 0

## 2016-07-03 DIAGNOSIS — Z01419 Encounter for gynecological examination (general) (routine) without abnormal findings: Secondary | ICD-10-CM | POA: Diagnosis not present

## 2016-07-03 DIAGNOSIS — Z124 Encounter for screening for malignant neoplasm of cervix: Secondary | ICD-10-CM | POA: Diagnosis not present

## 2017-02-04 DIAGNOSIS — Z23 Encounter for immunization: Secondary | ICD-10-CM | POA: Diagnosis not present

## 2017-02-04 DIAGNOSIS — Z0000 Encounter for general adult medical examination without abnormal findings: Secondary | ICD-10-CM | POA: Diagnosis not present

## 2017-07-11 DIAGNOSIS — Z124 Encounter for screening for malignant neoplasm of cervix: Secondary | ICD-10-CM | POA: Diagnosis not present

## 2017-07-11 DIAGNOSIS — Z01419 Encounter for gynecological examination (general) (routine) without abnormal findings: Secondary | ICD-10-CM | POA: Diagnosis not present

## 2017-07-14 DIAGNOSIS — H6993 Unspecified Eustachian tube disorder, bilateral: Secondary | ICD-10-CM | POA: Diagnosis not present

## 2017-08-14 DIAGNOSIS — H6993 Unspecified Eustachian tube disorder, bilateral: Secondary | ICD-10-CM | POA: Diagnosis not present

## 2017-12-29 DIAGNOSIS — L7211 Pilar cyst: Secondary | ICD-10-CM | POA: Diagnosis not present

## 2018-02-13 DIAGNOSIS — Z3046 Encounter for surveillance of implantable subdermal contraceptive: Secondary | ICD-10-CM | POA: Diagnosis not present

## 2018-03-19 DIAGNOSIS — H6982 Other specified disorders of Eustachian tube, left ear: Secondary | ICD-10-CM | POA: Diagnosis not present

## 2018-05-20 DIAGNOSIS — Z0000 Encounter for general adult medical examination without abnormal findings: Secondary | ICD-10-CM | POA: Diagnosis not present

## 2018-05-20 DIAGNOSIS — Z13220 Encounter for screening for lipoid disorders: Secondary | ICD-10-CM | POA: Diagnosis not present

## 2018-06-30 DIAGNOSIS — M79642 Pain in left hand: Secondary | ICD-10-CM | POA: Diagnosis not present

## 2018-06-30 DIAGNOSIS — R202 Paresthesia of skin: Secondary | ICD-10-CM | POA: Diagnosis not present

## 2018-06-30 DIAGNOSIS — M79641 Pain in right hand: Secondary | ICD-10-CM | POA: Diagnosis not present

## 2018-09-21 DIAGNOSIS — Z79899 Other long term (current) drug therapy: Secondary | ICD-10-CM | POA: Diagnosis not present

## 2018-09-21 DIAGNOSIS — R51 Headache: Secondary | ICD-10-CM | POA: Diagnosis not present

## 2018-09-21 DIAGNOSIS — G43719 Chronic migraine without aura, intractable, without status migrainosus: Secondary | ICD-10-CM | POA: Diagnosis not present

## 2018-09-21 DIAGNOSIS — Z049 Encounter for examination and observation for unspecified reason: Secondary | ICD-10-CM | POA: Diagnosis not present

## 2018-09-24 DIAGNOSIS — M25562 Pain in left knee: Secondary | ICD-10-CM | POA: Diagnosis not present

## 2018-09-28 DIAGNOSIS — M7632 Iliotibial band syndrome, left leg: Secondary | ICD-10-CM | POA: Diagnosis not present

## 2018-09-28 DIAGNOSIS — M25662 Stiffness of left knee, not elsewhere classified: Secondary | ICD-10-CM | POA: Diagnosis not present

## 2019-05-14 NOTE — L&D Delivery Note (Signed)
Delivery Note At 4:20 PM a viable and healthy female was delivered via Vaginal, Spontaneous (Presentation: Left Occiput Anterior).  APGAR: 8, 9; weight pending .   Placenta status: Spontaneous, Intact.  Cord: 3 vessels with subjectively Short cord, however not measured.    The patient presented this morning with spontaneous rupture of membranes with meconium stained fluid. Her labor progressed normally and with pushing with 3 contractions for less than 10 minutes she delivered a vigorous female infant in the LOA presentation. Anterior shoulder delivered spontaneously. The posterior shoulder was then delivered and the infant was passed to the waiting maternal abdomen. The umbilical cord was subjectively short. After a 1 minute delay in cord clamping the umbilical cord was ligated. The placenta delivered spontaneously, intact, with three-vessel cord. A second-degree perineal laceration was repaired with 3-0 Vicryl in the usual fashion. Small amount of atony was noted after the repair which resolved with bimanual massage. Mother and baby are doing well following delivery.  Anesthesia: Epidural Episiotomy: None Lacerations: 2nd degree Suture Repair: 3.0 vicryl Est. Blood Loss (mL):  588 cc  Mom to postpartum.  Baby to Couplet care / Skin to Skin.  Waynard Reeds 12/27/2019, 4:46 PM

## 2019-08-28 LAB — OB RESULTS CONSOLE RUBELLA ANTIBODY, IGM: Rubella: IMMUNE

## 2019-08-28 LAB — OB RESULTS CONSOLE GC/CHLAMYDIA
Chlamydia: NEGATIVE
Gonorrhea: NEGATIVE

## 2019-08-28 LAB — OB RESULTS CONSOLE HEPATITIS B SURFACE ANTIGEN: Hepatitis B Surface Ag: NEGATIVE

## 2019-08-28 LAB — OB RESULTS CONSOLE HIV ANTIBODY (ROUTINE TESTING): HIV: NONREACTIVE

## 2019-08-28 LAB — OB RESULTS CONSOLE RPR: RPR: NONREACTIVE

## 2019-12-10 LAB — OB RESULTS CONSOLE GBS: GBS: NEGATIVE

## 2019-12-27 ENCOUNTER — Other Ambulatory Visit: Payer: Self-pay

## 2019-12-27 ENCOUNTER — Inpatient Hospital Stay (HOSPITAL_COMMUNITY)
Admission: AD | Admit: 2019-12-27 | Discharge: 2019-12-29 | DRG: 807 | Disposition: A | Discharge status: Home / Self Care | Payer: 59 | Attending: Obstetrics and Gynecology | Admitting: Obstetrics and Gynecology

## 2019-12-27 ENCOUNTER — Inpatient Hospital Stay (HOSPITAL_COMMUNITY): Payer: 59 | Admitting: Anesthesiology

## 2019-12-27 ENCOUNTER — Encounter (HOSPITAL_COMMUNITY): Payer: Self-pay | Admitting: Obstetrics and Gynecology

## 2019-12-27 DIAGNOSIS — O99284 Endocrine, nutritional and metabolic diseases complicating childbirth: Secondary | ICD-10-CM | POA: Diagnosis present

## 2019-12-27 DIAGNOSIS — Z20822 Contact with and (suspected) exposure to covid-19: Secondary | ICD-10-CM | POA: Diagnosis present

## 2019-12-27 DIAGNOSIS — O329XX0 Maternal care for malpresentation of fetus, unspecified, not applicable or unspecified: Secondary | ICD-10-CM

## 2019-12-27 DIAGNOSIS — O26893 Other specified pregnancy related conditions, third trimester: Secondary | ICD-10-CM | POA: Diagnosis present

## 2019-12-27 DIAGNOSIS — Z3A39 39 weeks gestation of pregnancy: Secondary | ICD-10-CM

## 2019-12-27 DIAGNOSIS — E039 Hypothyroidism, unspecified: Secondary | ICD-10-CM | POA: Diagnosis present

## 2019-12-27 LAB — SARS CORONAVIRUS 2 BY RT PCR (HOSPITAL ORDER, PERFORMED IN ~~LOC~~ HOSPITAL LAB): SARS Coronavirus 2: NEGATIVE

## 2019-12-27 LAB — CBC
HCT: 35.2 % — ABNORMAL LOW (ref 36.0–46.0)
Hemoglobin: 11.7 g/dL — ABNORMAL LOW (ref 12.0–15.0)
MCH: 26.3 pg (ref 26.0–34.0)
MCHC: 33.2 g/dL (ref 30.0–36.0)
MCV: 79.1 fL — ABNORMAL LOW (ref 80.0–100.0)
Platelets: 233 10*3/uL (ref 150–400)
RBC: 4.45 MIL/uL (ref 3.87–5.11)
RDW: 12.8 % (ref 11.5–15.5)
WBC: 9 10*3/uL (ref 4.0–10.5)
nRBC: 0 % (ref 0.0–0.2)

## 2019-12-27 LAB — POCT FERN TEST: POCT Fern Test: POSITIVE

## 2019-12-27 LAB — RPR: RPR Ser Ql: NONREACTIVE

## 2019-12-27 LAB — TYPE AND SCREEN
ABO/RH(D): O NEG
Antibody Screen: POSITIVE

## 2019-12-27 MED ORDER — LIDOCAINE HCL (PF) 1 % IJ SOLN
INTRAMUSCULAR | Status: DC | PRN
  Administered 2019-12-27: 8 mL via EPIDURAL
  Administered 2019-12-27: 4 mL via EPIDURAL

## 2019-12-27 MED ORDER — FLEET ENEMA 7-19 GM/118ML RE ENEM: 1.0000 | ENEMA | RECTAL | Status: DC | PRN

## 2019-12-27 MED ORDER — ONDANSETRON HCL 4 MG PO TABS: 4.0000 mg | ORAL_TABLET | ORAL | Status: DC | PRN

## 2019-12-27 MED ORDER — TETANUS-DIPHTH-ACELL PERTUSSIS 5-2.5-18.5 LF-MCG/0.5 IM SUSP: 0.5000 mL | Freq: Once | INTRAMUSCULAR | Status: DC

## 2019-12-27 MED ORDER — LACTATED RINGERS IV SOLN: INTRAVENOUS | Status: DC

## 2019-12-27 MED ORDER — DIPHENHYDRAMINE HCL 50 MG/ML IJ SOLN: 12.5000 mg | INTRAMUSCULAR | Status: DC | PRN

## 2019-12-27 MED ORDER — WITCH HAZEL-GLYCERIN EX PADS: 1.0000 "application " | MEDICATED_PAD | CUTANEOUS | Status: DC | PRN

## 2019-12-27 MED ORDER — SODIUM CHLORIDE (PF) 0.9 % IJ SOLN
INTRAMUSCULAR | Status: DC | PRN
  Administered 2019-12-27: 12 mL/h via EPIDURAL

## 2019-12-27 MED ORDER — ZOLPIDEM TARTRATE 5 MG PO TABS: 5.0000 mg | ORAL_TABLET | Freq: Every evening | ORAL | Status: DC | PRN

## 2019-12-27 MED ORDER — LACTATED RINGERS IV SOLN
500.0000 mL | Freq: Once | INTRAVENOUS | Status: AC
  Administered 2019-12-27: 500 mL via INTRAVENOUS

## 2019-12-27 MED ORDER — METHYLERGONOVINE MALEATE 0.2 MG/ML IJ SOLN: 0.2000 mg | INTRAMUSCULAR | Status: DC | PRN

## 2019-12-27 MED ORDER — ACETAMINOPHEN 325 MG PO TABS: 650.0000 mg | ORAL_TABLET | ORAL | Status: DC | PRN

## 2019-12-27 MED ORDER — EPHEDRINE 5 MG/ML INJ: 10.0000 mg | INTRAVENOUS | Status: DC | PRN

## 2019-12-27 MED ORDER — LACTATED RINGERS IV SOLN: 500.0000 mL | INTRAVENOUS | Status: DC | PRN

## 2019-12-27 MED ORDER — COCONUT OIL OIL: 1.0000 "application " | TOPICAL_OIL | Status: DC | PRN

## 2019-12-27 MED ORDER — IBUPROFEN 600 MG PO TABS
600.0000 mg | ORAL_TABLET | Freq: Four times a day (QID) | ORAL | Status: DC
  Administered 2019-12-27 – 2019-12-29 (×6): 600 mg via ORAL
  Filled 2019-12-27 (×6): qty 1

## 2019-12-27 MED ORDER — BENZOCAINE-MENTHOL 20-0.5 % EX AERO: 1.0000 "application " | INHALATION_SPRAY | CUTANEOUS | Status: DC | PRN

## 2019-12-27 MED ORDER — LIDOCAINE HCL (PF) 1 % IJ SOLN: 30.0000 mL | INTRAMUSCULAR | Status: DC | PRN

## 2019-12-27 MED ORDER — PHENYLEPHRINE 40 MCG/ML (10ML) SYRINGE FOR IV PUSH (FOR BLOOD PRESSURE SUPPORT)
80.0000 ug | PREFILLED_SYRINGE | INTRAVENOUS | Status: DC | PRN
  Filled 2019-12-27: qty 10

## 2019-12-27 MED ORDER — ONDANSETRON HCL 4 MG/2ML IJ SOLN: 4.0000 mg | INTRAMUSCULAR | Status: DC | PRN

## 2019-12-27 MED ORDER — OXYCODONE-ACETAMINOPHEN 5-325 MG PO TABS: 2.0000 | ORAL_TABLET | ORAL | Status: DC | PRN

## 2019-12-27 MED ORDER — SOD CITRATE-CITRIC ACID 500-334 MG/5ML PO SOLN: 30.0000 mL | ORAL | Status: DC | PRN

## 2019-12-27 MED ORDER — OXYTOCIN BOLUS FROM INFUSION
333.0000 mL | Freq: Once | INTRAVENOUS | Status: AC
  Administered 2019-12-27: 333 mL via INTRAVENOUS

## 2019-12-27 MED ORDER — ONDANSETRON HCL 4 MG/2ML IJ SOLN: 4.0000 mg | Freq: Four times a day (QID) | INTRAMUSCULAR | Status: DC | PRN

## 2019-12-27 MED ORDER — OXYTOCIN-SODIUM CHLORIDE 30-0.9 UT/500ML-% IV SOLN
1.0000 m[IU]/min | INTRAVENOUS | Status: DC
  Administered 2019-12-27: 2 m[IU]/min via INTRAVENOUS
  Filled 2019-12-27: qty 500

## 2019-12-27 MED ORDER — OXYCODONE HCL 5 MG PO TABS: 10.0000 mg | ORAL_TABLET | ORAL | Status: DC | PRN

## 2019-12-27 MED ORDER — PHENYLEPHRINE 40 MCG/ML (10ML) SYRINGE FOR IV PUSH (FOR BLOOD PRESSURE SUPPORT): 80.0000 ug | PREFILLED_SYRINGE | INTRAVENOUS | Status: DC | PRN

## 2019-12-27 MED ORDER — SIMETHICONE 80 MG PO CHEW: 80.0000 mg | CHEWABLE_TABLET | ORAL | Status: DC | PRN

## 2019-12-27 MED ORDER — FENTANYL-BUPIVACAINE-NACL 0.5-0.125-0.9 MG/250ML-% EP SOLN
12.0000 mL/h | EPIDURAL | Status: DC | PRN
  Filled 2019-12-27: qty 250

## 2019-12-27 MED ORDER — TERBUTALINE SULFATE 1 MG/ML IJ SOLN: 0.2500 mg | Freq: Once | INTRAMUSCULAR | Status: DC | PRN

## 2019-12-27 MED ORDER — OXYCODONE-ACETAMINOPHEN 5-325 MG PO TABS: 1.0000 | ORAL_TABLET | ORAL | Status: DC | PRN

## 2019-12-27 MED ORDER — FENTANYL CITRATE (PF) 100 MCG/2ML IJ SOLN: 100.0000 ug | INTRAMUSCULAR | Status: DC | PRN

## 2019-12-27 MED ORDER — DIPHENHYDRAMINE HCL 25 MG PO CAPS: 25.0000 mg | ORAL_CAPSULE | Freq: Four times a day (QID) | ORAL | Status: DC | PRN

## 2019-12-27 MED ORDER — METHYLERGONOVINE MALEATE 0.2 MG PO TABS: 0.2000 mg | ORAL_TABLET | ORAL | Status: DC | PRN

## 2019-12-27 MED ORDER — DIBUCAINE (PERIANAL) 1 % EX OINT: 1.0000 "application " | TOPICAL_OINTMENT | CUTANEOUS | Status: DC | PRN

## 2019-12-27 MED ORDER — OXYTOCIN-SODIUM CHLORIDE 30-0.9 UT/500ML-% IV SOLN
2.5000 [IU]/h | INTRAVENOUS | Status: DC
  Administered 2019-12-27: 2.5 [IU]/h via INTRAVENOUS

## 2019-12-27 MED ORDER — SENNOSIDES-DOCUSATE SODIUM 8.6-50 MG PO TABS
2.0000 | ORAL_TABLET | ORAL | Status: DC
  Administered 2019-12-27: 2 via ORAL
  Filled 2019-12-27 (×2): qty 2

## 2019-12-27 MED ORDER — OXYCODONE HCL 5 MG PO TABS: 5.0000 mg | ORAL_TABLET | ORAL | Status: DC | PRN

## 2019-12-27 MED ORDER — PRENATAL MULTIVITAMIN CH
1.0000 | ORAL_TABLET | Freq: Every day | ORAL | Status: DC
  Administered 2019-12-28: 1 via ORAL
  Filled 2019-12-27: qty 1

## 2019-12-27 NOTE — H&P (Signed)
Teresa Perkins is a 34 y.o. female presenting for leaking fluid  34 yo G3P1011 @ 39+0 presents with complaint of leaking fluid and was confirmed SROM. Pt leaking meconium stained fluid. Her pregnancy has been uncomplicated to this point. OB History    Gravida  3   Para  1   Term  1   Preterm      AB  1   Living  1     SAB  1   TAB      Ectopic      Multiple  0   Live Births  1          Past Medical History:  Diagnosis Date  . Anxiety   . Arthritis   . Depression   . Headache    Past Surgical History:  Procedure Laterality Date  . KNEE SURGERY    . WISDOM TOOTH EXTRACTION     Family History: family history is not on file. Social History:  reports that she has never smoked. She has never used smokeless tobacco. She reports that she does not drink alcohol and does not use drugs.     Maternal Diabetes: No Genetic Screening: Normal Maternal Ultrasounds/Referrals: Normal Fetal Ultrasounds or other Referrals:  None Maternal Substance Abuse:  No Significant Maternal Medications:  Meds include: Syntroid Significant Maternal Lab Results:  Group B Strep negative Other Comments:  None  Review of Systems History   Blood pressure 130/75, pulse 73, temperature 98.3 F (36.8 C), temperature source Oral, resp. rate 18, unknown if currently breastfeeding. Exam Physical Exam  Prenatal labs: ABO, Rh:  O neg  Antibody:  Neg Rubella:  Imm RPR:   NR HBsAg:   Neg HIV:   NR GBS:   Neg  Assessment/Plan: 1) Admit 2) EPidural on request 3) Pitocin augmentation   Waynard Reeds 12/27/2019, 8:31 AM

## 2019-12-27 NOTE — Anesthesia Preprocedure Evaluation (Signed)
Anesthesia Evaluation  Patient identified by MRN, date of birth, ID band Patient awake    Reviewed: Allergy & Precautions, H&P , NPO status , Patient's Chart, lab work & pertinent test results  History of Anesthesia Complications Negative for: history of anesthetic complications  Airway Mallampati: II  TM Distance: >3 FB Neck ROM: full    Dental no notable dental hx. (+) Teeth Intact   Pulmonary neg pulmonary ROS,    Pulmonary exam normal breath sounds clear to auscultation       Cardiovascular negative cardio ROS Normal cardiovascular exam Rhythm:regular Rate:Normal     Neuro/Psych    GI/Hepatic negative GI ROS, Neg liver ROS,   Endo/Other  negative endocrine ROS  Renal/GU negative Renal ROS  negative genitourinary   Musculoskeletal   Abdominal (+) + obese,   Peds  Hematology negative hematology ROS (+)   Anesthesia Other Findings   Reproductive/Obstetrics (+) Pregnancy                             Anesthesia Physical  Anesthesia Plan  ASA: II  Anesthesia Plan: Epidural   Post-op Pain Management:    Induction:   PONV Risk Score and Plan:   Airway Management Planned:   Additional Equipment:   Intra-op Plan:   Post-operative Plan:   Informed Consent: I have reviewed the patients History and Physical, chart, labs and discussed the procedure including the risks, benefits and alternatives for the proposed anesthesia with the patient or authorized representative who has indicated his/her understanding and acceptance.       Plan Discussed with:   Anesthesia Plan Comments:         Anesthesia Quick Evaluation

## 2019-12-27 NOTE — Progress Notes (Signed)
Post feeling faint after getting OOB to void

## 2019-12-27 NOTE — Progress Notes (Signed)
Patient ID: Teresa Perkins, female   DOB: Dec 18, 1985, 34 y.o.   MRN: [id number]   CTSP: Re Fetel deceleration. Pt developed a spontaneous deceleration in the FHR to 60s that slowely returned to the 90s after FSE placed, pitocin discontinued, and maternal position changes . Ph had been running a low baseline with FHR in 120s  FHR now 115-120 with + accels, + variability, + Scalp stim Cvx 9/C/0. Cervix is reducible but comes back after exam. toco Q 2-3  A/P Bradycardia likely due to rapid maternal cervical dilitation FWB reassuring Anticipate SVD

## 2019-12-27 NOTE — Plan of Care (Signed)
Pt demonstrated understanding 

## 2019-12-27 NOTE — Progress Notes (Signed)
Telemetry monitors changed to f & m monitors

## 2019-12-27 NOTE — Anesthesia Procedure Notes (Signed)
Epidural Patient location during procedure: OB Start time: 12/27/2019 12:20 PM End time: 12/27/2019 12:22 PM  Staffing Anesthesiologist: Leilani Able, MD Performed: anesthesiologist   Preanesthetic Checklist Completed: patient identified, IV checked, site marked, risks and benefits discussed, surgical consent, monitors and equipment checked, pre-op evaluation and timeout performed  Epidural Patient position: sitting Prep: DuraPrep and site prepped and draped Patient monitoring: continuous pulse ox and blood pressure Approach: midline Location: L3-L4 Injection technique: LOR air  Needle:  Needle type: Tuohy  Needle gauge: 17 G Needle length: 9 cm and 9 Needle insertion depth: 6 cm Catheter type: closed end flexible Catheter size: 19 Gauge Catheter at skin depth: 11 cm Test dose: negative and Other  Assessment Events: blood not aspirated, injection not painful, no injection resistance, no paresthesia and negative IV test  Additional Notes Reason for block:procedure for pain

## 2019-12-27 NOTE — Progress Notes (Signed)
Grossly ruptured, CNM exam not required. Cervical exam deferred per Dr . Tenny Craw. Vertex presentation confirmed via bedside ultrasound.  Clayton Bibles, MSN, CNM Certified Nurse Midwife, [facility] for [employer], [facility] 12/27/19 8:09 AM

## 2019-12-27 NOTE — MAU Note (Signed)
Pt presents to MAU reporting water broke around 0645 and fluid appeared green.  Feeling good fetal movement.  Unsure if having CTX, but reports feeling some pressure.

## 2019-12-28 LAB — CBC
HCT: 31 % — ABNORMAL LOW (ref 36.0–46.0)
Hemoglobin: 10.3 g/dL — ABNORMAL LOW (ref 12.0–15.0)
MCH: 26.7 pg (ref 26.0–34.0)
MCHC: 33.2 g/dL (ref 30.0–36.0)
MCV: 80.3 fL (ref 80.0–100.0)
Platelets: 209 10*3/uL (ref 150–400)
RBC: 3.86 MIL/uL — ABNORMAL LOW (ref 3.87–5.11)
RDW: 13 % (ref 11.5–15.5)
WBC: 12.6 10*3/uL — ABNORMAL HIGH (ref 4.0–10.5)
nRBC: 0 % (ref 0.0–0.2)

## 2019-12-28 MED ORDER — RHO D IMMUNE GLOBULIN 1500 UNIT/2ML IJ SOSY
300.0000 ug | PREFILLED_SYRINGE | Freq: Once | INTRAMUSCULAR | Status: AC
  Administered 2019-12-28: 300 ug via INTRAVENOUS
  Filled 2019-12-28: qty 2

## 2019-12-28 NOTE — Progress Notes (Signed)
Patient is eating, ambulating, voiding.  Pain control is good.  Vitals:   12/27/19 1843 12/27/19 1945 12/27/19 2342 12/28/19 0454  BP: 101/88 124/88 123/67 125/78  Pulse: 69 71 67 63  Resp: 18 18 18 18   Temp: 98 F (36.7 C) 97.9 F (36.6 C)  97.7 F (36.5 C)  TempSrc:  Oral  Oral  SpO2:  97% 98% 100%  Weight:      Height:        Fundus firm Perineum without swelling.  Lab Results  Component Value Date   WBC 12.6 (H) 12/28/2019   HGB 10.3 (L) 12/28/2019   HCT 31.0 (L) 12/28/2019   MCV 80.3 12/28/2019   PLT 209 12/28/2019    --/--/O NEG (08/16 0815)/RI  A/P Post partum day 1.  Routine care.  Expect d/c tomorrow.  Pt felt faint getting out of bed earlier but H/H is stable.    Baby is RH POS- Rhogam today. 06-30-1993

## 2019-12-28 NOTE — Anesthesia Postprocedure Evaluation (Signed)
Anesthesia Post Note  Patient: JAMYRAH SAUR  Procedure(s) Performed: AN AD HOC LABOR EPIDURAL     Patient location during evaluation: Mother Baby Anesthesia Type: Epidural Level of consciousness: awake, awake and alert and oriented Pain management: pain level controlled Vital Signs Assessment: post-procedure vital signs reviewed and stable Respiratory status: spontaneous breathing, nonlabored ventilation and respiratory function stable Cardiovascular status: stable Postop Assessment: no headache, no backache, patient able to bend at knees, no apparent nausea or vomiting, adequate PO intake and able to ambulate Anesthetic complications: no   No complications documented.  Last Vitals:  Vitals:   12/27/19 2342 12/28/19 0454  BP: 123/67 125/78  Pulse: 67 63  Resp: 18 18  Temp:  36.5 C  SpO2: 98% 100%    Last Pain:  Vitals:   12/28/19 0454  TempSrc: Oral  PainSc:    Pain Goal:                   Janus Vlcek

## 2019-12-28 NOTE — Progress Notes (Signed)
MOB was referred for history of depression/anxiety.  * Referral screened out by Clinical Social Worker because none of the following criteria appear to apply:  ~ History of anxiety/depression during this pregnancy, or of post-partum depression following prior delivery. ~ Diagnosis of anxiety and/or depression within last 3 years OR * MOB's symptoms currently being treated with medication and/or therapy. MOB currently taking Zoloft.  Please contact the Clinical Social Worker if needs arise, by MOB request.  Denni France, LCSW [facility] [phone] 

## 2019-12-29 LAB — RH IG WORKUP (INCLUDES ABO/RH)
ABO/RH(D): O NEG
Fetal Screen: NEGATIVE
Gestational Age(Wks): 39
Unit division: 0

## 2019-12-29 LAB — SURGICAL PATHOLOGY

## 2019-12-29 NOTE — Discharge Summary (Signed)
Postpartum Discharge Summary     Patient Name: Teresa Perkins DOB: [birth year] MRN: [id number]  Date of admission: 12/27/2019 Delivery date:12/27/2019  Delivering provider: Vanessa Kick  Date of discharge: 12/29/2019  Admitting diagnosis: Normal labor [O80, Z37.9] Spontaneous vaginal delivery [O80] Intrauterine pregnancy: [redacted]w[redacted]d    Secondary diagnosis:  Active Problems:   Normal labor   Spontaneous vaginal delivery  Additional problems: hypothyroid    Discharge diagnosis: Term Pregnancy Delivered                                              Post partum procedures:none Augmentation: Pitocin Complications: None  Hospital course: Onset of Labor With Vaginal Delivery      34y.o. yo [id number] admitted in Latent Labor on 12/27/2019. Patient had an uncomplicated labor course as follows:  Membrane Rupture Time/Date: 6:45 AM ,12/27/2019   Delivery Method:Vaginal, Spontaneous  Episiotomy: None  Lacerations:  2nd degree  Patient had an uncomplicated postpartum course.  She is ambulating, tolerating a regular diet, passing flatus, and urinating well. Patient is discharged home in stable condition on 12/29/19.  Newborn Data: Birth date:12/27/2019  Birth time:4:20 PM  Gender:Female  Living status:Living  Apgars:8 ,9  Weight:2860 g   Magnesium Sulfate received: No BMZ received: No Rhophylac:N/A MMR:N/A T-DaP:see office note Flu: N/A Transfusion:No  Physical exam  Vitals:   12/28/19 0454 12/28/19 1445 12/28/19 2030 12/29/19 0531  BP: 125/78 116/62 134/84 127/81  Pulse: 63 64 97 66  Resp: _0 Temp: 97.7 F (36.5 C) 98 F (36.7 C) 98.2 F (36.8 C) 98.2 F (36.8 C)  TempSrc: Oral Oral Oral Oral  SpO2: 100% 99% 97% 99%  Weight:      Height:       General: alert, cooperative and no distress Lochia: appropriate Uterine Fundus: firm DVT Evaluation: No evidence of DVT seen on physical exam. Labs: Lab Results  Component Value Date   WBC 12.6 (H)  12/28/2019   HGB 10.3 (L) 12/28/2019   HCT 31.0 (L) 12/28/2019   MCV 80.3 12/28/2019   PLT 209 12/28/2019   No flowsheet data found. Edinburgh Score: Edinburgh Postnatal Depression Scale Screening Tool 12/27/2019  I have been able to laugh and see the funny side of things. 0  I have looked forward with enjoyment to things. 0  I have blamed myself unnecessarily when things went wrong. 1  I have been anxious or worried for no good reason. 2  I have felt scared or panicky for no good reason. 1  Things have been getting on top of me. 1  I have been so unhappy that I have had difficulty sleeping. 0  I have felt sad or miserable. 0  I have been so unhappy that I have been crying. 0  The thought of harming myself has occurred to me. 0  Edinburgh Postnatal Depression Scale Total 5      After visit meds:  Allergies as of 12/29/2019      Reactions   Doxycycline Nausea And Vomiting         Medication List    STOP taking these medications   acetaminophen 325 MG tablet Commonly known as: TYLENOL   ibuprofen 600 MG tablet Commonly known as: ADVIL   oxyCODONE-acetaminophen 5-325 MG tablet Commonly known as: PERCOCET/ROXICET     TAKE  these medications   levothyroxine 25 MCG tablet Commonly known as: SYNTHROID Take 25 mcg by mouth daily before breakfast.   prenatal multivitamin Tabs tablet Take 1 tablet by mouth daily.   sertraline 100 MG tablet Commonly known as: ZOLOFT Take 100 mg by mouth at bedtime.   sertraline 25 MG tablet Commonly known as: ZOLOFT Take 25 mg by mouth daily.        Discharge home in stable condition Infant Feeding: see lac note Infant Disposition:home with mother Discharge instruction: per After Visit Summary and Postpartum booklet. Activity: Advance as tolerated. Pelvic rest for 6 weeks.  Diet: routine diet Anticipated Birth Control: Unsure Postpartum Appointment:4 weeks Additional Postpartum F/U: Postpartum Depression checkup Future  Appointments:No future appointments. Follow up Visit:  Follow-up Information    Vanessa Kick, MD Follow up in 4 week(s).   Specialty: Obstetrics and Gynecology Contact information: [address] [place] [address] [phone]                   12/29/2019 Allyn Kenner, DO

## 2020-09-18 ENCOUNTER — Telehealth: Payer: Self-pay | Admitting: Oncology

## 2020-09-18 NOTE — Telephone Encounter (Signed)
Received a new hem referral from Dr. Jaci Lazier for IDA. Teresa Perkins has been cld and scheduled to see Dr. Clelia Croft on 5/11 at 2pm. Pt aware to arrive 20 minutes early.

## 2020-09-20 ENCOUNTER — Inpatient Hospital Stay: Payer: 59 | Attending: Oncology | Admitting: Oncology

## 2020-09-20 ENCOUNTER — Encounter: Payer: Self-pay | Admitting: Oncology

## 2020-09-20 ENCOUNTER — Other Ambulatory Visit: Payer: Self-pay

## 2020-09-20 VITALS — BP 135/77 | HR 77 | Temp 97.9°F | Resp 18 | Ht 65.0 in | Wt 220.8 lb

## 2020-09-20 DIAGNOSIS — E039 Hypothyroidism, unspecified: Secondary | ICD-10-CM | POA: Diagnosis not present

## 2020-09-20 DIAGNOSIS — O9081 Anemia of the puerperium: Secondary | ICD-10-CM | POA: Diagnosis not present

## 2020-09-20 DIAGNOSIS — D500 Iron deficiency anemia secondary to blood loss (chronic): Secondary | ICD-10-CM

## 2020-09-20 DIAGNOSIS — D509 Iron deficiency anemia, unspecified: Secondary | ICD-10-CM

## 2020-09-20 HISTORY — DX: Iron deficiency anemia secondary to blood loss (chronic): D50.0

## 2020-09-20 NOTE — Progress Notes (Signed)
Reason for the request:    Iron deficiency anemia  HPI: I was asked by Jarrett Soho, PA-C to evaluate Teresa Perkins for the evaluation of iron deficiency.  She is a 35 year old woman with hypothyroidism as well as a history of iron deficiency.  She is close to 9 months post partum after delivering term pregnancy vaginally without any major complications.  During her labor her hemoglobin was 10.3.  Repeat laboratory testing obtained in May 2022 by her primary care provider showed white cell count of 6.3, hemoglobin of 11.8 with a platelet count of 256.  Her MCV was 72.5.  He had normal electrolytes and liver function test as well as TSH.  Iron panel showed iron level of 21, transferrin of 448 with saturation of 3% and iron binding capacity of 627.  He is already on iron replacement therapy orally. Clinically, she reports complaints of fatigue occasional night sweats that has been ongoing since her birth of her child.  She denies any hematochezia, melena or hemoptysis.  She denies any heavy menstrual cycles at this time currently on oral contraceptives.   She does not report any headaches, blurry vision, syncope or seizures. Does not report any fevers, chills or sweats.  Does not report any cough, wheezing or hemoptysis.  Does not report any chest pain, palpitation, orthopnea or leg edema.  Does not report any nausea, vomiting or abdominal pain.  Does not report any constipation or diarrhea.  Does not report any skeletal complaints.    Does not report frequency, urgency or hematuria.  Does not report any skin rashes or lesions. Does not report any heat or cold intolerance.  Does not report any lymphadenopathy or petechiae.  Does not report any anxiety or depression.  Remaining review of systems is negative.    Past Medical History:  Diagnosis Date  . Anxiety   . Arthritis   . Depression   . Headache   :  Past Surgical History:  Procedure Laterality Date  . KNEE SURGERY    . WISDOM TOOTH  EXTRACTION    :   Current Outpatient Medications:  .  levothyroxine (SYNTHROID) 25 MCG tablet, Take 25 mcg by mouth daily before breakfast., Disp: , Rfl:  .  Prenatal Vit-Fe Fumarate-FA (PRENATAL MULTIVITAMIN) TABS, Take 1 tablet by mouth daily., Disp: , Rfl:  .  sertraline (ZOLOFT) 100 MG tablet, Take 100 mg by mouth at bedtime. , Disp: , Rfl:  .  sertraline (ZOLOFT) 25 MG tablet, Take 25 mg by mouth daily., Disp: , Rfl: :  Allergies  Allergen Reactions  . Doxycycline Nausea And Vomiting       :  No family history on file.:  Social History   Socioeconomic History  . Marital status: Married    Spouse name: Not on file  . Number of children: Not on file  . Years of education: Not on file  . Highest education level: Not on file  Occupational History  . Not on file  Tobacco Use  . Smoking status: Never Smoker  . Smokeless tobacco: Never Used  Substance and Sexual Activity  . Alcohol use: No  . Drug use: No  . Sexual activity: Yes  Other Topics Concern  . Not on file  Social History Narrative  . Not on file   Social Determinants of Health   Financial Resource Strain: Not on file  Food Insecurity: Not on file  Transportation Needs: Not on file  Physical Activity: Not on file  Stress: Not on  file  Social Connections: Not on file  Intimate Partner Violence: Not on file  :  Pertinent items are noted in HPI.  Exam: Blood pressure 135/77, pulse 77, temperature 97.9 F (36.6 C), temperature source Tympanic, resp. rate 18, height 5\' 5"  (1.651 m), weight 220 lb 12.8 oz (100.2 kg), SpO2 100 %, unknown if currently breastfeeding.  ECOG 1  General appearance: alert and cooperative appeared without distress. Head: atraumatic without any abnormalities. Eyes: conjunctivae/corneas clear. PERRL.  Sclera anicteric. Throat: lips, mucosa, and tongue normal; without oral thrush or ulcers. Resp: clear to auscultation bilaterally without rhonchi, wheezes or dullness to  percussion. Cardio: regular rate and rhythm, S1, S2 normal, no murmur, click, rub or gallop GI: soft, non-tender; bowel sounds normal; no masses,  no organomegaly Skin: Skin color, texture, turgor normal. No rashes or lesions Lymph nodes: Cervical, supraclavicular, and axillary nodes normal. Neurologic: Grossly normal without any motor, sensory or deep tendon reflexes. Musculoskeletal: No joint deformity or effusion.   CBC    Component Value Date/Time   WBC 12.6 (H) 12/28/2019 0500   RBC 3.86 (L) 12/28/2019 0500   HGB 10.3 (L) 12/28/2019 0500   HCT 31.0 (L) 12/28/2019 0500   PLT 209 12/28/2019 0500   MCV 80.3 12/28/2019 0500   MCH 26.7 12/28/2019 0500   MCHC 33.2 12/28/2019 0500   RDW 13.0 12/28/2019 0500     Assessment and Plan:    35 year old woman with:  1.  Iron deficiency anemia noted on laboratory testing in May 2022 after presenting with a hemoglobin of 11.8 iron level of 21 and increased iron binding capacity.  This is in the setting of recent vaginal delivery in the last 9 months while on oral iron replacement therapy.  The differential diagnosis and management options were reviewed at this time.  It is possible that he is not getting adequate replacement orally given poor iron absorption issues or inadequate intake.  Increasing oral iron replacement would be a possibility versus intravenous iron infusion.  Risks and benefits of IV iron infusion were discussed.  Complications include arthralgias, myalgias and rarely anaphylaxis were discussed.  Iron gluconate or iron sucrase preparation would be used in this particular setting.  After discussion today, she is agreeable to proceed with intravenous iron and she understands the risks and benefits associated with iron infusion.   2.  Fatigue and sweats: I see no evidence to suggest malignancy or hematological disorder.  It is likely unrelated to her anemia.  Fixing her anemia will improve however her overall stamina and  might improve fatigue.  3.  Follow-up: will be in the next 6 months to repeat laboratory testing.  45  minutes were dedicated to this visit. The time was spent on reviewing laboratory data, discussing treatment options, discussing differential diagnosis and answering questions regarding future plan.    A copy of this consult has been forwarded to the requesting provider

## 2020-09-28 ENCOUNTER — Telehealth: Payer: Self-pay | Admitting: Oncology

## 2020-09-28 NOTE — Telephone Encounter (Signed)
Pt called in to r/s iron infusion, unable to come in on 5/27 due to being out of town. Pt was already scheduled the next two weeks so I r/s her cancelled appt to the week of 6/13. Pt aware.

## 2020-09-29 ENCOUNTER — Inpatient Hospital Stay: Payer: 59

## 2020-09-29 ENCOUNTER — Other Ambulatory Visit: Payer: Self-pay

## 2020-09-29 VITALS — BP 140/83 | HR 60 | Temp 98.0°F | Resp 20

## 2020-09-29 DIAGNOSIS — D500 Iron deficiency anemia secondary to blood loss (chronic): Secondary | ICD-10-CM

## 2020-09-29 DIAGNOSIS — O9081 Anemia of the puerperium: Secondary | ICD-10-CM | POA: Diagnosis not present

## 2020-09-29 MED ORDER — SODIUM CHLORIDE 0.9 % IV SOLN
Freq: Once | INTRAVENOUS | Status: AC
Start: 2020-09-29 — End: 2020-09-29
  Filled 2020-09-29: qty 250

## 2020-09-29 MED ORDER — SODIUM CHLORIDE 0.9 % IV SOLN
125.0000 mg | Freq: Once | INTRAVENOUS | Status: AC
  Administered 2020-09-29: 125 mg via INTRAVENOUS
  Filled 2020-09-29: qty 10

## 2020-10-06 ENCOUNTER — Ambulatory Visit: Payer: 59

## 2020-10-13 ENCOUNTER — Ambulatory Visit: Payer: 59

## 2020-10-13 ENCOUNTER — Other Ambulatory Visit: Payer: Self-pay

## 2020-10-13 ENCOUNTER — Inpatient Hospital Stay: Payer: 59 | Attending: Oncology

## 2020-10-13 VITALS — BP 143/87 | HR 60 | Temp 98.0°F | Resp 18

## 2020-10-13 DIAGNOSIS — D500 Iron deficiency anemia secondary to blood loss (chronic): Secondary | ICD-10-CM

## 2020-10-13 DIAGNOSIS — D509 Iron deficiency anemia, unspecified: Secondary | ICD-10-CM | POA: Diagnosis present

## 2020-10-13 MED ORDER — SODIUM CHLORIDE 0.9 % IV SOLN
125.0000 mg | Freq: Once | INTRAVENOUS | Status: AC
  Administered 2020-10-13: 125 mg via INTRAVENOUS
  Filled 2020-10-13: qty 10

## 2020-10-13 MED ORDER — SODIUM CHLORIDE 0.9 % IV SOLN
Freq: Once | INTRAVENOUS | Status: AC
  Filled 2020-10-13: qty 250

## 2020-10-13 NOTE — Patient Instructions (Signed)
Sodium Ferric Gluconate Complex injection What is this medicine? SODIUM FERRIC GLUCONATE COMPLEX (SOE dee um FER ik GLOO koe nate KOM pleks) is an iron replacement. It is used with epoetin therapy to treat low iron levels in patients who are receiving hemodialysis. This medicine may be used for other purposes; ask your health care provider or pharmacist if you have questions. COMMON BRAND NAME(S): Ferrlecit, Nulecit What should I tell my health care provider before I take this medicine? They need to know if you have any of the following conditions:  anemia that is not from iron deficiency  high levels of iron in the body  an unusual or allergic reaction to iron, benzyl alcohol, other medicines, foods, dyes, or preservatives  pregnant or are trying to become pregnant  breast-feeding How should I use this medicine? This medicine is for infusion into a vein. It is given by a health care professional in a hospital or clinic setting. Talk to your pediatrician regarding the use of this medicine in children. While this drug may be prescribed for children as young as 6 years old for selected conditions, precautions do apply. Overdosage: If you think you have taken too much of this medicine contact a poison control center or emergency room at once. NOTE: This medicine is only for you. Do not share this medicine with others. What if I miss a dose? It is important not to miss your dose. Call your doctor or health care professional if you are unable to keep an appointment. What may interact with this medicine? Do not take this medicine with any of the following medications:  deferoxamine  dimercaprol  other iron products This medicine may also interact with the following medications:  chloramphenicol  deferasirox  medicine for blood pressure like enalapril This list may not describe all possible interactions. Give your health care provider a list of all the medicines, herbs,  non-prescription drugs, or dietary supplements you use. Also tell them if you smoke, drink alcohol, or use illegal drugs. Some items may interact with your medicine. What should I watch for while using this medicine? Your condition will be monitored carefully while you are receiving this medicine. Visit your doctor for check-ups as directed. What side effects may I notice from receiving this medicine? Side effects that you should report to your doctor or health care professional as soon as possible:  allergic reactions like skin rash, itching or hives, swelling of the face, lips, or tongue  breathing problems  changes in hearing  changes in vision  chills, flushing, or sweating  fast, irregular heartbeat  feeling faint or lightheaded, falls  fever, flu-like symptoms  high or low blood pressure  pain, tingling, numbness in the hands or feet  severe pain in the chest, back, flanks, or groin  swelling of the ankles, feet, hands  trouble passing urine or change in the amount of urine  unusually weak or tired Side effects that usually do not require medical attention (report to your doctor or health care professional if they continue or are bothersome):  cramps  dark colored stools  diarrhea  headache  nausea, vomiting  stomach upset This list may not describe all possible side effects. Call your doctor for medical advice about side effects. You may report side effects to FDA at 1-800-FDA-1088. Where should I keep my medicine? This drug is given in a hospital or clinic and will not be stored at home. NOTE: This sheet is a summary. It may not cover all   possible information. If you have questions about this medicine, talk to your doctor, pharmacist, or health care provider.  2021 Elsevier/Gold Standard (2007-12-30 15:58:57)   

## 2020-10-17 ENCOUNTER — Other Ambulatory Visit: Payer: Self-pay

## 2020-10-17 ENCOUNTER — Inpatient Hospital Stay: Payer: 59

## 2020-10-17 VITALS — BP 131/82 | HR 58 | Temp 98.4°F | Resp 18

## 2020-10-17 DIAGNOSIS — D500 Iron deficiency anemia secondary to blood loss (chronic): Secondary | ICD-10-CM

## 2020-10-17 DIAGNOSIS — D509 Iron deficiency anemia, unspecified: Secondary | ICD-10-CM | POA: Diagnosis not present

## 2020-10-17 MED ORDER — SODIUM CHLORIDE 0.9 % IV SOLN
Freq: Once | INTRAVENOUS | Status: AC
  Filled 2020-10-17: qty 250

## 2020-10-17 MED ORDER — SODIUM CHLORIDE 0.9 % IV SOLN
125.0000 mg | Freq: Once | INTRAVENOUS | Status: AC
  Administered 2020-10-17: 125 mg via INTRAVENOUS
  Filled 2020-10-17: qty 10

## 2020-10-17 NOTE — Patient Instructions (Signed)
Sodium Ferric Gluconate Complex injection What is this medicine? SODIUM FERRIC GLUCONATE COMPLEX (SOE dee um FER ik GLOO koe nate KOM pleks) is an iron replacement. It is used with epoetin therapy to treat low iron levels in patients who are receiving hemodialysis. This medicine may be used for other purposes; ask your health care provider or pharmacist if you have questions. COMMON BRAND NAME(S): Ferrlecit, Nulecit What should I tell my health care provider before I take this medicine? They need to know if you have any of the following conditions:  anemia that is not from iron deficiency  high levels of iron in the body  an unusual or allergic reaction to iron, benzyl alcohol, other medicines, foods, dyes, or preservatives  pregnant or are trying to become pregnant  breast-feeding How should I use this medicine? This medicine is for infusion into a vein. It is given by a health care professional in a hospital or clinic setting. Talk to your pediatrician regarding the use of this medicine in children. While this drug may be prescribed for children as Nazeer Romney as 34 years old for selected conditions, precautions do apply. Overdosage: If you think you have taken too much of this medicine contact a poison control center or emergency room at once. NOTE: This medicine is only for you. Do not share this medicine with others. What if I miss a dose? It is important not to miss your dose. Call your doctor or health care professional if you are unable to keep an appointment. What may interact with this medicine? Do not take this medicine with any of the following medications:  deferoxamine  dimercaprol  other iron products This medicine may also interact with the following medications:  chloramphenicol  deferasirox  medicine for blood pressure like enalapril This list may not describe all possible interactions. Give your health care provider a list of all the medicines, herbs,  non-prescription drugs, or dietary supplements you use. Also tell them if you smoke, drink alcohol, or use illegal drugs. Some items may interact with your medicine. What should I watch for while using this medicine? Your condition will be monitored carefully while you are receiving this medicine. Visit your doctor for check-ups as directed. What side effects may I notice from receiving this medicine? Side effects that you should report to your doctor or health care professional as soon as possible:  allergic reactions like skin rash, itching or hives, swelling of the face, lips, or tongue  breathing problems  changes in hearing  changes in vision  chills, flushing, or sweating  fast, irregular heartbeat  feeling faint or lightheaded, falls  fever, flu-like symptoms  high or low blood pressure  pain, tingling, numbness in the hands or feet  severe pain in the chest, back, flanks, or groin  swelling of the ankles, feet, hands  trouble passing urine or change in the amount of urine  unusually weak or tired Side effects that usually do not require medical attention (report to your doctor or health care professional if they continue or are bothersome):  cramps  dark colored stools  diarrhea  headache  nausea, vomiting  stomach upset This list may not describe all possible side effects. Call your doctor for medical advice about side effects. You may report side effects to FDA at 1-800-FDA-1088. Where should I keep my medicine? This drug is given in a hospital or clinic and will not be stored at home. NOTE: This sheet is a summary. It may not cover all   possible information. If you have questions about this medicine, talk to your doctor, pharmacist, or health care provider.  2021 Elsevier/Gold Standard (2007-12-30 15:58:57)   

## 2020-10-18 ENCOUNTER — Ambulatory Visit: Payer: 59

## 2020-10-26 ENCOUNTER — Other Ambulatory Visit: Payer: Self-pay

## 2020-10-26 ENCOUNTER — Inpatient Hospital Stay: Payer: 59

## 2020-10-26 VITALS — BP 123/69 | HR 63 | Temp 98.2°F | Resp 16

## 2020-10-26 DIAGNOSIS — D509 Iron deficiency anemia, unspecified: Secondary | ICD-10-CM | POA: Diagnosis not present

## 2020-10-26 DIAGNOSIS — D500 Iron deficiency anemia secondary to blood loss (chronic): Secondary | ICD-10-CM

## 2020-10-26 MED ORDER — SODIUM CHLORIDE 0.9 % IV SOLN
125.0000 mg | Freq: Once | INTRAVENOUS | Status: AC
  Administered 2020-10-26: 125 mg via INTRAVENOUS
  Filled 2020-10-26: qty 10

## 2020-10-26 MED ORDER — SODIUM CHLORIDE 0.9 % IV SOLN
Freq: Once | INTRAVENOUS | Status: AC
  Filled 2020-10-26: qty 250

## 2020-10-26 NOTE — Patient Instructions (Signed)
Sodium Ferric Gluconate Complex injection What is this medication? SODIUM FERRIC GLUCONATE COMPLEX (SOE dee um FER ik GLOO koe nate KOM pleks) is an iron replacement. It is used with epoetin therapy to treat low iron levelsin patients who are receiving hemodialysis. This medicine may be used for other purposes; ask your health care provider orpharmacist if you have questions. COMMON BRAND NAME(S): Ferrlecit, Nulecit What should I tell my care team before I take this medication? They need to know if you have any of the following conditions: anemia that is not from iron deficiency high levels of iron in the body an unusual or allergic reaction to iron, benzyl alcohol, other medicines, foods, dyes, or preservatives pregnant or are trying to become pregnant breast-feeding How should I use this medication? This medicine is for infusion into a vein. It is given by a health careprofessional in a hospital or clinic setting. Talk to your pediatrician regarding the use of this medicine in children. While this drug may be prescribed for children as young as 6 years old for selectedconditions, precautions do apply. Overdosage: If you think you have taken too much of this medicine contact apoison control center or emergency room at once. NOTE: This medicine is only for you. Do not share this medicine with others. What if I miss a dose? It is important not to miss your dose. Call your doctor or health careprofessional if you are unable to keep an appointment. What may interact with this medication? Do not take this medicine with any of the following medications: deferoxamine dimercaprol other iron products This medicine may also interact with the following medications: chloramphenicol deferasirox medicine for blood pressure like enalapril This list may not describe all possible interactions. Give your health care provider a list of all the medicines, herbs, non-prescription drugs, or dietary  supplements you use. Also tell them if you smoke, drink alcohol, or use illegaldrugs. Some items may interact with your medicine. What should I watch for while using this medication? Your condition will be monitored carefully while you are receiving thismedicine. Visit your doctor for check-ups as directed. What side effects may I notice from receiving this medication? Side effects that you should report to your doctor or health care professionalas soon as possible: allergic reactions like skin rash, itching or hives, swelling of the face, lips, or tongue breathing problems changes in hearing changes in vision chills, flushing, or sweating fast, irregular heartbeat feeling faint or lightheaded, falls fever, flu-like symptoms high or low blood pressure pain, tingling, numbness in the hands or feet severe pain in the chest, back, flanks, or groin swelling of the ankles, feet, hands trouble passing urine or change in the amount of urine unusually weak or tired Side effects that usually do not require medical attention (report to yourdoctor or health care professional if they continue or are bothersome): cramps dark colored stools diarrhea headache nausea, vomiting stomach upset This list may not describe all possible side effects. Call your doctor for medical advice about side effects. You may report side effects to FDA at1-800-FDA-1088. Where should I keep my medication? This drug is given in a hospital or clinic and will not be stored at home. NOTE: This sheet is a summary. It may not cover all possible information. If you have questions about this medicine, talk to your doctor, pharmacist, orhealth care provider.  2022 Elsevier/Gold Standard (2007-12-30 15:58:57)  

## 2021-03-21 ENCOUNTER — Other Ambulatory Visit: Payer: Self-pay

## 2021-03-21 ENCOUNTER — Inpatient Hospital Stay: Payer: 59 | Admitting: Oncology

## 2021-03-21 ENCOUNTER — Inpatient Hospital Stay: Payer: 59 | Attending: Oncology

## 2021-03-21 VITALS — BP 131/80 | HR 66 | Temp 98.1°F | Resp 20 | Wt 213.7 lb

## 2021-03-21 DIAGNOSIS — E039 Hypothyroidism, unspecified: Secondary | ICD-10-CM | POA: Insufficient documentation

## 2021-03-21 DIAGNOSIS — D500 Iron deficiency anemia secondary to blood loss (chronic): Secondary | ICD-10-CM

## 2021-03-21 DIAGNOSIS — D509 Iron deficiency anemia, unspecified: Secondary | ICD-10-CM | POA: Diagnosis present

## 2021-03-21 LAB — CBC WITH DIFFERENTIAL (CANCER CENTER ONLY)
Abs Immature Granulocytes: 0.01 10*3/uL (ref 0.00–0.07)
Basophils Absolute: 0.1 10*3/uL (ref 0.0–0.1)
Basophils Relative: 1 %
Eosinophils Absolute: 0.2 10*3/uL (ref 0.0–0.5)
Eosinophils Relative: 4 %
HCT: 42.6 % (ref 36.0–46.0)
Hemoglobin: 14.9 g/dL (ref 12.0–15.0)
Immature Granulocytes: 0 %
Lymphocytes Relative: 26 %
Lymphs Abs: 1.6 10*3/uL (ref 0.7–4.0)
MCH: 29 pg (ref 26.0–34.0)
MCHC: 35 g/dL (ref 30.0–36.0)
MCV: 82.9 fL (ref 80.0–100.0)
Monocytes Absolute: 0.5 10*3/uL (ref 0.1–1.0)
Monocytes Relative: 9 %
Neutro Abs: 3.7 10*3/uL (ref 1.7–7.7)
Neutrophils Relative %: 60 %
Platelet Count: 242 10*3/uL (ref 150–400)
RBC: 5.14 MIL/uL — ABNORMAL HIGH (ref 3.87–5.11)
RDW: 12.3 % (ref 11.5–15.5)
WBC Count: 6.1 10*3/uL (ref 4.0–10.5)
nRBC: 0 % (ref 0.0–0.2)

## 2021-03-21 LAB — IRON AND TIBC
Iron: 104 ug/dL (ref 41–142)
Saturation Ratios: 21 % (ref 21–57)
TIBC: 493 ug/dL — ABNORMAL HIGH (ref 236–444)
UIBC: 388 ug/dL — ABNORMAL HIGH (ref 120–384)

## 2021-03-21 LAB — FERRITIN: Ferritin: 13 ng/mL (ref 11–307)

## 2021-03-21 NOTE — Progress Notes (Signed)
Hematology and Oncology Follow Up Visit  Teresa Perkins [id number] 1985-11-17 35 y.o. 03/21/2021 9:13 AM Jarrett Soho, [place], [place], [place]   Principle Diagnosis: 35 year old woman with iron deficiency anemia diagnosed in May 2022.   Prior Therapy: She is status post ferric gluconate for a total of 500 mg completed in June 2022.  Current therapy: Oral iron therapy.  Interim History: [name] returns today for a follow-up visit.  Since her last visit, she reports no major changes in her health.  She tolerated IV iron infusion without any complications.  She did notice improvement in her energy and performance status but does report occasional hot flashes and fatigue.  She denies any nausea, vomiting or abdominal pain.  She denies any hematochezia or melena.  She has reported regular menstrual cycles at this time.     Medications: I have reviewed the patient's current medications.  Current Outpatient Medications  Medication Sig Dispense Refill   levothyroxine (SYNTHROID) 25 MCG tablet Take 25 mcg by mouth daily before breakfast.     Prenatal Vit-Fe Fumarate-FA (PRENATAL MULTIVITAMIN) TABS Take 1 tablet by mouth daily.     sertraline (ZOLOFT) 100 MG tablet Take 100 mg by mouth at bedtime.      sertraline (ZOLOFT) 25 MG tablet Take 25 mg by mouth daily.     No current facility-administered medications for this visit.     Allergies:  Allergies  Allergen Reactions   Doxycycline Nausea And Vomiting          Physical Exam: Blood pressure 131/80, pulse 66, temperature 98.1 F (36.7 C), resp. rate 20, weight 213 lb 11.2 oz (96.9 kg), SpO2 100 %, unknown if currently breastfeeding.  ECOG:  0    General appearance: Comfortable appearing without any discomfort Head: Normocephalic without any trauma Oropharynx: Mucous membranes are moist and pink without any thrush or ulcers. Eyes: Pupils are equal and round reactive to light. Lymph nodes: No cervical,  supraclavicular, inguinal or axillary lymphadenopathy.   Heart:regular rate and rhythm.  S1 and S2 without leg edema. Lung: Clear without any rhonchi or wheezes.  No dullness to percussion. Abdomin: Soft, nontender, nondistended with good bowel sounds.  No hepatosplenomegaly. Musculoskeletal: No joint deformity or effusion.  Full range of motion noted. Neurological: No deficits noted on motor, sensory and deep tendon reflex exam. Skin: No petechial rash or dryness.  Appeared moist.     Lab Results: Lab Results  Component Value Date   WBC 12.6 (H) 12/28/2019   HGB 10.3 (L) 12/28/2019   HCT 31.0 (L) 12/28/2019   MCV 80.3 12/28/2019   PLT 209 12/28/2019     Chemistry   No results found for: NA, K, CL, CO2, BUN, CREATININE, GLU No results found for: CALCIUM, ALKPHOS, AST, ALT, BILITOT       Impression and Plan:  35 year old woman with:   1.  Iron deficiency anemia presented with iron of 21 and a hemoglobin of 11.8 in May 2022.    Her disease status was updated at this time after IV iron infusion.  Laboratory data from today showed a hemoglobin of 14.9 with iron studies are currently pending.  I do not believe that she will require any further IV iron at this time.  Frequent monitoring might be required moving forward.  She is agreeable to proceed.    2.  Fatigue and sweats: Mostly improved although she does have element of hypothyroidism which is contributing to her symptoms.   3.  Follow-up: In 6 months  for repeat follow-up.    20  minutes were spent on this encounter.  The time was dedicated to reviewing laboratory data, disease status update and future plan of care discussion.     [name], MD 11/9/20229:13 AM

## 2021-08-24 ENCOUNTER — Telehealth: Payer: Self-pay | Admitting: Oncology

## 2021-08-24 NOTE — Telephone Encounter (Signed)
Called patient regarding upcoming appointment, left a voicemail. 

## 2021-09-19 ENCOUNTER — Inpatient Hospital Stay: Payer: 59

## 2021-09-19 ENCOUNTER — Inpatient Hospital Stay: Payer: 59 | Admitting: Oncology

## 2021-10-10 ENCOUNTER — Encounter: Payer: Self-pay | Admitting: Oncology

## 2021-10-12 ENCOUNTER — Encounter (INDEPENDENT_AMBULATORY_CARE_PROVIDER_SITE_OTHER): Payer: 59 | Admitting: Ophthalmology

## 2021-10-12 DIAGNOSIS — H43813 Vitreous degeneration, bilateral: Secondary | ICD-10-CM

## 2023-01-14 DIAGNOSIS — Z6833 Body mass index (BMI) 33.0-33.9, adult: Secondary | ICD-10-CM | POA: Diagnosis not present

## 2023-01-14 DIAGNOSIS — Z01419 Encounter for gynecological examination (general) (routine) without abnormal findings: Secondary | ICD-10-CM | POA: Diagnosis not present

## 2023-02-03 DIAGNOSIS — G43719 Chronic migraine without aura, intractable, without status migrainosus: Secondary | ICD-10-CM | POA: Diagnosis not present

## 2023-02-04 DIAGNOSIS — Z13220 Encounter for screening for lipoid disorders: Secondary | ICD-10-CM | POA: Diagnosis not present

## 2023-02-04 DIAGNOSIS — Z23 Encounter for immunization: Secondary | ICD-10-CM | POA: Diagnosis not present

## 2023-02-04 DIAGNOSIS — E038 Other specified hypothyroidism: Secondary | ICD-10-CM | POA: Diagnosis not present

## 2023-02-04 DIAGNOSIS — F419 Anxiety disorder, unspecified: Secondary | ICD-10-CM | POA: Diagnosis not present

## 2023-02-04 DIAGNOSIS — Z0000 Encounter for general adult medical examination without abnormal findings: Secondary | ICD-10-CM | POA: Diagnosis not present

## 2023-02-13 DIAGNOSIS — M542 Cervicalgia: Secondary | ICD-10-CM | POA: Diagnosis not present

## 2023-02-13 DIAGNOSIS — G43719 Chronic migraine without aura, intractable, without status migrainosus: Secondary | ICD-10-CM | POA: Diagnosis not present

## 2023-02-27 DIAGNOSIS — G43719 Chronic migraine without aura, intractable, without status migrainosus: Secondary | ICD-10-CM | POA: Diagnosis not present

## 2023-02-27 DIAGNOSIS — M542 Cervicalgia: Secondary | ICD-10-CM | POA: Diagnosis not present

## 2023-03-06 ENCOUNTER — Other Ambulatory Visit (HOSPITAL_COMMUNITY): Payer: Self-pay

## 2023-03-06 ENCOUNTER — Encounter: Payer: Self-pay | Admitting: Oncology

## 2023-03-06 DIAGNOSIS — Z79899 Other long term (current) drug therapy: Secondary | ICD-10-CM | POA: Diagnosis not present

## 2023-03-06 DIAGNOSIS — F902 Attention-deficit hyperactivity disorder, combined type: Secondary | ICD-10-CM | POA: Diagnosis not present

## 2023-03-06 MED ORDER — ATOMOXETINE HCL 25 MG PO CAPS
25.0000 mg | ORAL_CAPSULE | Freq: Every day | ORAL | 0 refills | Status: DC
  Filled 2023-03-06: qty 60, 32d supply, fill #0

## 2023-03-06 MED ORDER — LISDEXAMFETAMINE DIMESYLATE 20 MG PO CAPS
20.0000 mg | ORAL_CAPSULE | Freq: Every day | ORAL | 0 refills | Status: DC
  Filled 2023-03-06: qty 30, 30d supply, fill #0

## 2023-03-12 DIAGNOSIS — M542 Cervicalgia: Secondary | ICD-10-CM | POA: Diagnosis not present

## 2023-03-12 DIAGNOSIS — G43719 Chronic migraine without aura, intractable, without status migrainosus: Secondary | ICD-10-CM | POA: Diagnosis not present

## 2023-03-27 DIAGNOSIS — M542 Cervicalgia: Secondary | ICD-10-CM | POA: Diagnosis not present

## 2023-03-27 DIAGNOSIS — G43719 Chronic migraine without aura, intractable, without status migrainosus: Secondary | ICD-10-CM | POA: Diagnosis not present

## 2023-03-31 ENCOUNTER — Other Ambulatory Visit (HOSPITAL_COMMUNITY): Payer: Self-pay

## 2023-03-31 MED ORDER — LISDEXAMFETAMINE DIMESYLATE 20 MG PO CAPS
20.0000 mg | ORAL_CAPSULE | Freq: Every day | ORAL | 0 refills | Status: DC
  Filled 2023-03-31 – 2023-04-03 (×2): qty 30, 30d supply, fill #0

## 2023-04-01 ENCOUNTER — Other Ambulatory Visit (HOSPITAL_COMMUNITY): Payer: Self-pay

## 2023-04-01 MED ORDER — ATOMOXETINE HCL 25 MG PO CAPS
50.0000 mg | ORAL_CAPSULE | Freq: Every day | ORAL | 6 refills | Status: AC
  Filled 2023-04-03: qty 60, 30d supply, fill #0
  Filled 2023-05-08: qty 60, 30d supply, fill #1
  Filled 2023-06-09: qty 60, 30d supply, fill #2
  Filled 2023-07-10: qty 60, 30d supply, fill #3
  Filled 2023-08-16: qty 60, 30d supply, fill #4
  Filled 2023-09-25: qty 60, 30d supply, fill #5

## 2023-04-03 ENCOUNTER — Other Ambulatory Visit (HOSPITAL_COMMUNITY): Payer: Self-pay

## 2023-04-08 DIAGNOSIS — G43719 Chronic migraine without aura, intractable, without status migrainosus: Secondary | ICD-10-CM | POA: Diagnosis not present

## 2023-04-08 DIAGNOSIS — M542 Cervicalgia: Secondary | ICD-10-CM | POA: Diagnosis not present

## 2023-04-28 DIAGNOSIS — M542 Cervicalgia: Secondary | ICD-10-CM | POA: Diagnosis not present

## 2023-04-28 DIAGNOSIS — G43719 Chronic migraine without aura, intractable, without status migrainosus: Secondary | ICD-10-CM | POA: Diagnosis not present

## 2023-05-09 ENCOUNTER — Other Ambulatory Visit (HOSPITAL_COMMUNITY): Payer: Self-pay

## 2023-05-09 ENCOUNTER — Other Ambulatory Visit: Payer: Self-pay

## 2023-05-09 MED ORDER — LISDEXAMFETAMINE DIMESYLATE 20 MG PO CAPS
20.0000 mg | ORAL_CAPSULE | Freq: Every day | ORAL | 0 refills | Status: DC
  Filled 2023-05-09 (×2): qty 15, 15d supply, fill #0

## 2023-05-29 ENCOUNTER — Emergency Department (HOSPITAL_BASED_OUTPATIENT_CLINIC_OR_DEPARTMENT_OTHER): Payer: Self-pay

## 2023-05-29 ENCOUNTER — Other Ambulatory Visit: Payer: Self-pay

## 2023-05-29 ENCOUNTER — Encounter: Payer: Self-pay | Admitting: Oncology

## 2023-05-29 ENCOUNTER — Encounter (HOSPITAL_BASED_OUTPATIENT_CLINIC_OR_DEPARTMENT_OTHER): Payer: Self-pay

## 2023-05-29 ENCOUNTER — Emergency Department (HOSPITAL_BASED_OUTPATIENT_CLINIC_OR_DEPARTMENT_OTHER)
Admission: EM | Admit: 2023-05-29 | Discharge: 2023-05-30 | Disposition: A | Discharge status: Home / Self Care | Payer: Self-pay | Attending: Emergency Medicine | Admitting: Emergency Medicine

## 2023-05-29 DIAGNOSIS — N200 Calculus of kidney: Secondary | ICD-10-CM | POA: Diagnosis not present

## 2023-05-29 DIAGNOSIS — N132 Hydronephrosis with renal and ureteral calculous obstruction: Secondary | ICD-10-CM | POA: Diagnosis not present

## 2023-05-29 DIAGNOSIS — R1031 Right lower quadrant pain: Secondary | ICD-10-CM | POA: Diagnosis not present

## 2023-05-29 LAB — URINALYSIS, ROUTINE W REFLEX MICROSCOPIC
Bacteria, UA: NONE SEEN
Bilirubin Urine: NEGATIVE
Glucose, UA: NEGATIVE mg/dL
Ketones, ur: NEGATIVE mg/dL
Leukocytes,Ua: NEGATIVE
Nitrite: NEGATIVE
Protein, ur: 30 mg/dL — AB
Specific Gravity, Urine: 1.025 (ref 1.005–1.030)
pH: 6.5 (ref 5.0–8.0)

## 2023-05-29 LAB — CBC
HCT: 40.7 % (ref 36.0–46.0)
Hemoglobin: 14.8 g/dL (ref 12.0–15.0)
MCH: 28.3 pg (ref 26.0–34.0)
MCHC: 36.4 g/dL — ABNORMAL HIGH (ref 30.0–36.0)
MCV: 77.8 fL — ABNORMAL LOW (ref 80.0–100.0)
Platelets: 295 10*3/uL (ref 150–400)
RBC: 5.23 MIL/uL — ABNORMAL HIGH (ref 3.87–5.11)
RDW: 11.9 % (ref 11.5–15.5)
WBC: 7 10*3/uL (ref 4.0–10.5)
nRBC: 0 % (ref 0.0–0.2)

## 2023-05-29 LAB — COMPREHENSIVE METABOLIC PANEL
ALT: 11 U/L (ref 0–44)
AST: 13 U/L — ABNORMAL LOW (ref 15–41)
Albumin: 4.7 g/dL (ref 3.5–5.0)
Alkaline Phosphatase: 65 U/L (ref 38–126)
Anion gap: 14 (ref 5–15)
BUN: 12 mg/dL (ref 6–20)
CO2: 18 mmol/L — ABNORMAL LOW (ref 22–32)
Calcium: 9.6 mg/dL (ref 8.9–10.3)
Chloride: 103 mmol/L (ref 98–111)
Creatinine, Ser: 0.89 mg/dL (ref 0.44–1.00)
GFR, Estimated: >60 mL/min (ref >60)
Glucose, Bld: 103 mg/dL — ABNORMAL HIGH (ref 70–99)
Potassium: 3 mmol/L — ABNORMAL LOW (ref 3.5–5.1)
Sodium: 135 mmol/L (ref 135–145)
Total Bilirubin: 0.7 mg/dL (ref 0.0–1.2)
Total Protein: 8.1 g/dL (ref 6.5–8.1)

## 2023-05-29 LAB — PREGNANCY, URINE: Preg Test, Ur: NEGATIVE

## 2023-05-29 LAB — LIPASE, BLOOD: Lipase: 12 U/L (ref 11–51)

## 2023-05-29 MED ORDER — ONDANSETRON HCL 4 MG PO TABS: 4.0000 mg | ORAL_TABLET | Freq: Three times a day (TID) | ORAL | 0 refills | Status: AC | PRN

## 2023-05-29 MED ORDER — OXYCODONE-ACETAMINOPHEN 5-325 MG PO TABS: 1.0000 | ORAL_TABLET | Freq: Three times a day (TID) | ORAL | 0 refills | Status: AC | PRN

## 2023-05-29 MED ORDER — MORPHINE SULFATE (PF) 4 MG/ML IV SOLN
4.0000 mg | Freq: Once | INTRAVENOUS | Status: AC
  Administered 2023-05-29: 4 mg via INTRAVENOUS
  Filled 2023-05-29: qty 1

## 2023-05-29 MED ORDER — TAMSULOSIN HCL 0.4 MG PO CAPS
0.4000 mg | ORAL_CAPSULE | Freq: Once | ORAL | Status: AC
  Administered 2023-05-29: 0.4 mg via ORAL
  Filled 2023-05-29: qty 1

## 2023-05-29 MED ORDER — IOHEXOL 300 MG/ML  SOLN
100.0000 mL | Freq: Once | INTRAMUSCULAR | Status: AC | PRN
  Administered 2023-05-29: 100 mL via INTRAVENOUS

## 2023-05-29 MED ORDER — POTASSIUM CHLORIDE CRYS ER 20 MEQ PO TBCR
40.0000 meq | EXTENDED_RELEASE_TABLET | Freq: Once | ORAL | Status: AC
  Administered 2023-05-29: 40 meq via ORAL
  Filled 2023-05-29: qty 2

## 2023-05-29 MED ORDER — TAMSULOSIN HCL 0.4 MG PO CAPS: 0.4000 mg | ORAL_CAPSULE | Freq: Every day | ORAL | 0 refills | Status: AC

## 2023-05-29 MED ORDER — KETOROLAC TROMETHAMINE 15 MG/ML IJ SOLN
15.0000 mg | Freq: Once | INTRAMUSCULAR | Status: AC
  Administered 2023-05-29: 15 mg via INTRAVENOUS
  Filled 2023-05-29: qty 1

## 2023-05-29 MED ORDER — ONDANSETRON HCL 4 MG/2ML IJ SOLN
4.0000 mg | Freq: Once | INTRAMUSCULAR | Status: AC
  Administered 2023-05-29: 4 mg via INTRAVENOUS
  Filled 2023-05-29: qty 2

## 2023-05-29 NOTE — Discharge Instructions (Addendum)
It was a pleasure caring for you today.  Workup today was showing a kidney stone.  Please call the urology clinic early tomorrow morning to set up a follow-up appointment.  Seek emergency care if experiencing any new or worsening symptoms.  Alternating between 650 mg Tylenol and 400 mg Advil: The best way to alternate taking Acetaminophen (example Tylenol) and Ibuprofen (example Advil/Motrin) is to take them 3 hours apart. For example, if you take ibuprofen at 6 am you can then take Tylenol at 9 am. You can continue this regimen throughout the day, making sure you do not exceed the recommended maximum dose for each drug. Please also not that Percocet has Tylenol incorporated in it which should be considered when avoiding the 4g limit of daily tylenol dosing.

## 2023-05-29 NOTE — ED Notes (Signed)
New urine sample sent to lab for UA.

## 2023-05-29 NOTE — ED Provider Notes (Signed)
[place] EMERGENCY DEPARTMENT AT [facility] Provider Note   CSN: [id number] Arrival date & time: 05/29/23  1850     History  Chief Complaint  Patient presents with   Teresa Perkins    CAMONI CILIBERTO is a 38 y.o. female with PMHx anxiety, depression, headaches, arthritis who presents to ED concerned for RLQ Teresa Perkins x5 hours. Patient stating that Perkins initially felt dull but has been progressing in severity and is now 10/10. Patient did have an episode of vomiting while in waiting room which has since resolved. Patient stating that she has intermittent nausea.  Denies fever, chest Perkins, dyspnea, cough, diarrhea, dysuria, hematuria, hematochezia.    Teresa Perkins      Home Medications Prior to Admission medications   Medication Sig Start Date End Date Taking? Authorizing Provider  ondansetron (ZOFRAN) 4 MG tablet Take 1 tablet (4 mg total) by mouth every 8 (eight) hours as needed for up to 3 days for nausea or vomiting. 05/29/23 06/01/23 Yes Valrie Hart F, PA-C  oxyCODONE-acetaminophen (PERCOCET/ROXICET) 5-325 MG tablet Take 1 tablet by mouth every 8 (eight) hours as needed for up to 5 days for severe Perkins (Perkins score 7-10). 05/29/23 06/03/23 Yes Valrie Hart F, PA-C  tamsulosin (FLOMAX) 0.4 MG CAPS capsule Take 1 capsule (0.4 mg total) by mouth daily for 5 days. 05/29/23 06/03/23 Yes Valrie Hart F, PA-C  atomoxetine (STRATTERA) 25 MG capsule Take 2 capsules (50 mg total) by mouth daily. 04/01/23     levothyroxine (SYNTHROID) 25 MCG tablet Take 25 mcg by mouth daily before breakfast.    [provider]  lisdexamfetamine (VYVANSE) 20 MG capsule Take 1 capsule (20 mg total) by mouth daily. 05/09/23     Prenatal Vit-Fe Fumarate-FA (PRENATAL MULTIVITAMIN) TABS Take 1 tablet by mouth daily.    [provider]  sertraline (ZOLOFT) 100 MG tablet Take 100 mg by mouth at bedtime.     [provider]  sertraline (ZOLOFT) 25 MG  tablet Take 25 mg by mouth daily. 12/13/19   [provider]      Allergies    Doxycycline    Review of Systems   Review of Systems  Gastrointestinal:  Positive for Teresa Perkins.    Physical Exam Updated Vital Signs BP (!) 170/93 (BP Location: Right Arm)   Pulse 98   Temp 97.6 F (36.4 C) (Oral)   Resp 16   Ht 5\' 5"  (1.651 m)   Wt 95.3 kg   LMP  (LMP Unknown)   SpO2 98%   BMI 34.95 kg/m  Physical Exam Vitals and nursing note reviewed.  Constitutional:      General: She is not in acute distress.    Appearance: She is not ill-appearing or toxic-appearing.  HENT:     Head: Normocephalic and atraumatic.     Mouth/Throat:     Mouth: Mucous membranes are moist.     Pharynx: No posterior oropharyngeal erythema.  Eyes:     General: No scleral icterus.       Right eye: No discharge.        Left eye: No discharge.     Conjunctiva/sclera: Conjunctivae normal.  Cardiovascular:     Rate and Rhythm: Normal rate and regular rhythm.     Pulses: Normal pulses.     Heart sounds: Normal heart sounds. No murmur heard. Pulmonary:     Effort: Pulmonary effort is normal. No respiratory distress.     Breath sounds: Normal breath sounds. No wheezing,  rhonchi or rales.  Teresa:     General: Abdomen is flat. Bowel sounds are normal. There is no distension.     Palpations: Abdomen is soft. There is no mass.     Tenderness: There is Teresa tenderness in the right lower quadrant. There is no right CVA tenderness or left CVA tenderness.  Musculoskeletal:     Right lower leg: No edema.     Left lower leg: No edema.  Skin:    General: Skin is warm and dry.     Findings: No rash.  Neurological:     General: No focal deficit present.     Mental Status: She is alert and oriented to person, place, and time. Mental status is at baseline.  Psychiatric:        Mood and Affect: Mood normal.        Behavior: Behavior normal.     ED Results / Procedures / Treatments   Labs (all  labs ordered are listed, but only abnormal results are displayed) Labs Reviewed  COMPREHENSIVE METABOLIC PANEL - Abnormal; Notable for the following components:      Result Value   Potassium 3.0 (*)    CO2 18 (*)    Glucose, Bld 103 (*)    AST 13 (*)    All other components within normal limits  CBC - Abnormal; Notable for the following components:   RBC 5.23 (*)    MCV 77.8 (*)    MCHC 36.4 (*)    All other components within normal limits  URINALYSIS, ROUTINE W REFLEX MICROSCOPIC - Abnormal; Notable for the following components:   Color, Urine COLORLESS (*)    Hgb urine dipstick LARGE (*)    Protein, ur 30 (*)    All other components within normal limits  LIPASE, BLOOD  PREGNANCY, URINE    EKG None  Radiology CT ABDOMEN PELVIS W CONTRAST Result Date: 05/29/2023 CLINICAL DATA:  Right lower quadrant Perkins EXAM: CT ABDOMEN AND PELVIS WITH CONTRAST TECHNIQUE: Multidetector CT imaging of the abdomen and pelvis was performed using the standard protocol following bolus administration of intravenous contrast. RADIATION DOSE REDUCTION: This exam was performed according to the departmental dose-optimization program which includes automated exposure control, adjustment of the mA and/or kV according to patient size and/or use of iterative reconstruction technique. CONTRAST:  OMNIPAQUE IOHEXOL 300 MG/ML  SOLN COMPARISON:  None Available. FINDINGS: Lower chest: Lung bases demonstrate no acute airspace disease. Hepatobiliary: Gallstones. No focal hepatic abnormality or biliary dilatation Pancreas: Unremarkable. No pancreatic ductal dilatation or surrounding inflammatory changes. Spleen: Normal in size without focal abnormality. Adrenals/Urinary Tract: Adrenal glands are normal. Moderate right hydronephrosis and hydroureter, secondary to a 4 mm stone at the right UVJ. Patchy areas of hypoenhancement at the lower pole of the right kidney raising concern for pyelonephritis. Diffuse right urothelial  enhancement. Generalized hypoenhancement right kidney compared to left consistent with obstruction. Stomach/Bowel: Stomach is within normal limits. Appendix not well seen but no right lower quadrant inflammation no evidence of bowel wall thickening, distention, or inflammatory changes. Vascular/Lymphatic: No significant vascular findings are present. No enlarged Teresa or pelvic lymph nodes. Reproductive: Uterus and bilateral adnexa are unremarkable. Other: Negative for pelvic effusion or free air. Small fat containing umbilical hernia Musculoskeletal: No acute or suspicious osseous abnormality IMPRESSION: 1. Moderate right hydronephrosis and hydroureter, secondary to a 4 mm stone at the right UVJ. Patchy areas of hypoenhancement at the lower pole of the right kidney raising concern for pyelonephritis. 2. Gallstones.  Electronically Signed   By: Jasmine Pang M.D.   On: 05/29/2023 21:52    Procedures Procedures    Medications Ordered in ED Medications  iohexol (OMNIPAQUE) 300 MG/ML solution 100 mL (100 mLs Intravenous Contrast Given 05/29/23 2047)  morphine (PF) 4 MG/ML injection 4 mg (4 mg Intravenous Given 05/29/23 2306)  ondansetron (ZOFRAN) injection 4 mg (4 mg Intravenous Given 05/29/23 2307)  potassium chloride SA (KLOR-CON M) CR tablet 40 mEq (40 mEq Oral Given 05/29/23 2339)  tamsulosin (FLOMAX) capsule 0.4 mg (0.4 mg Oral Given 05/29/23 2337)  ketorolac (TORADOL) 15 MG/ML injection 15 mg (15 mg Intravenous Given 05/29/23 2339)    ED Course/ Medical Decision Making/ A&P                                 Medical Decision Making Amount and/or Complexity of Data Reviewed Labs: ordered. Radiology: ordered.  Risk Prescription drug management.    This patient presents to the ED for concern of Teresa Perkins, this involves an extensive number of treatment options, and is a complaint that carries with it a high risk of complications and morbidity.  The differential diagnosis includes  gastroenteritis, colitis, small bowel obstruction, appendicitis, cholecystitis, pancreatitis, nephrolithiasis, UTI, pyelonephritis   Co morbidities that complicate the patient evaluation  none   Additional history obtained:  PCP Delena Serve, PA-C   Problem List / ED Course / Critical interventions / Medication management  Patient presented for RLQ Teresa Perkins.  Patient did had episode of vomiting in the waiting room which has since resolved.  Patient denying any other infectious complaints today.  Physical exam with tenderness to palpation of RLQ.  No CVA tenderness.  Rest of physical exam unremarkable.  Patient afebrile with stable vitals. I Ordered, and personally interpreted labs.  CBC without leukocytosis or anemia.  UPT negative.  Lipase within normal limits.  CMP with mild hypokalemia 3.0 - provided patient with oral potassium supplementation which she tolerated well.  UA with large Hgb but otherwise not concerning for infection. I ordered imaging studies including CT Abd/Pelvis with contrast: evaluate for structural/surgical etiology of patients' severe Teresa Perkins. I independently visualized and interpreted imaging and I agree with the radiologist interpretation of 4 mm UVJ stone with moderate right hydronephrosis/hydroureter.  CT scan also mentioning possible pyelonephritis but patient without CVA tenderness, fever, leukocytosis, or concerning findings on UA. I am less concerned for pyelonephritis at this time. Patient tolerating PO intake in ED. shared labs results with patient and answered all questions.  Educated patient to drink plenty of water and call the provided urology clinic information early tomorrow morning.  Provided patient with urine strainer, Flomax prescription, and Percocet for breakthrough Perkins. Educated patient to seek emergency care if Perkins is worsening or if she has any new or worsening symptoms. Patient verbalized understanding of plan. I have reviewed the  patients home medicines and have made adjustments as needed Patient afebrile with stable vitals.  Provided with return precautions.  Discharged in good condition.  Ddx: These are considered less likely due to history of present illness and physical exam. -gastroenteritis: No vomiting in ED; no fever; tolerating PO intake -colitis: Denies diarrhea, patient afebrile  -small bowel obstruction: CT reassuring -appendicitis: Negative McBurney point, rebound tenderness, psoas, obturator sign  -cholecystitis: Negative Murphy sign; liver enzymes within normal limits  -pancreatitis: No LUQ tenderness to palpation, lipase within normal limits  -UTI/pyelonephritis: Denies urinary complaints; UA reassuring  Social Determinants of Health:  none   This note has been dictated using Teaching laboratory technician. Unfortunately, this method of dictation can sometimes lead to typographical or grammatical errors. I apologize for your inconvenience in advance if this occurs. Please do not hesitate to reach out to me if clarification is needed.          Final Clinical Impression(s) / ED Diagnoses Final diagnoses:  Kidney stone    Rx / DC Orders ED Discharge Orders          Ordered    tamsulosin (FLOMAX) 0.4 MG CAPS capsule  Daily        05/29/23 2341    oxyCODONE-acetaminophen (PERCOCET/ROXICET) 5-325 MG tablet  Every 8 hours PRN        05/29/23 2341    ondansetron (ZOFRAN) 4 MG tablet  Every 8 hours PRN        05/29/23 2341              Dorthy Cooler, PA-C 05/29/23 2354    Glyn Ade, MD 06/02/23 1540

## 2023-05-29 NOTE — ED Triage Notes (Addendum)
Pt reports sudden onset of RLQ abd pain x30 mins PTA. Pt reports nausea denies any vomiting.Pt reports she believes it is appendix. Pt denies any h/o abd surgeries. Pt reports it started off as dull pain and is now 10/10. Pt is tearful in triage.

## 2023-06-09 ENCOUNTER — Other Ambulatory Visit (HOSPITAL_COMMUNITY): Payer: Self-pay

## 2023-06-09 MED ORDER — LISDEXAMFETAMINE DIMESYLATE 20 MG PO CAPS
20.0000 mg | ORAL_CAPSULE | Freq: Every day | ORAL | 0 refills | Status: AC
  Filled 2023-06-09: qty 30, 30d supply, fill #0

## 2023-06-17 ENCOUNTER — Encounter: Payer: Self-pay | Admitting: Oncology

## 2023-06-18 ENCOUNTER — Encounter: Payer: Self-pay | Admitting: Oncology

## 2023-07-09 ENCOUNTER — Other Ambulatory Visit (HOSPITAL_COMMUNITY): Payer: Self-pay

## 2023-07-09 DIAGNOSIS — F902 Attention-deficit hyperactivity disorder, combined type: Secondary | ICD-10-CM | POA: Diagnosis not present

## 2023-07-09 DIAGNOSIS — F419 Anxiety disorder, unspecified: Secondary | ICD-10-CM | POA: Diagnosis not present

## 2023-07-09 DIAGNOSIS — Z79899 Other long term (current) drug therapy: Secondary | ICD-10-CM | POA: Diagnosis not present

## 2023-07-09 MED ORDER — AMPHETAMINE-DEXTROAMPHET ER 25 MG PO CP24
25.0000 mg | ORAL_CAPSULE | Freq: Every day | ORAL | 0 refills | Status: DC
  Filled 2023-07-09: qty 30, 30d supply, fill #0

## 2023-07-14 DIAGNOSIS — F902 Attention-deficit hyperactivity disorder, combined type: Secondary | ICD-10-CM | POA: Diagnosis not present

## 2023-07-14 DIAGNOSIS — Z79899 Other long term (current) drug therapy: Secondary | ICD-10-CM | POA: Diagnosis not present

## 2023-07-28 DIAGNOSIS — M542 Cervicalgia: Secondary | ICD-10-CM | POA: Diagnosis not present

## 2023-07-28 DIAGNOSIS — G43719 Chronic migraine without aura, intractable, without status migrainosus: Secondary | ICD-10-CM | POA: Diagnosis not present

## 2023-08-04 ENCOUNTER — Other Ambulatory Visit (HOSPITAL_COMMUNITY): Payer: Self-pay

## 2023-08-04 MED ORDER — AMPHETAMINE-DEXTROAMPHET ER 25 MG PO CP24
25.0000 mg | ORAL_CAPSULE | Freq: Every day | ORAL | 0 refills | Status: DC
  Filled 2023-08-06: qty 30, 30d supply, fill #0

## 2023-08-06 ENCOUNTER — Other Ambulatory Visit (HOSPITAL_COMMUNITY): Payer: Self-pay

## 2023-08-22 ENCOUNTER — Encounter: Payer: Self-pay | Admitting: Oncology

## 2023-09-08 ENCOUNTER — Other Ambulatory Visit (HOSPITAL_COMMUNITY): Payer: Self-pay

## 2023-09-08 DIAGNOSIS — G43719 Chronic migraine without aura, intractable, without status migrainosus: Secondary | ICD-10-CM | POA: Diagnosis not present

## 2023-09-08 DIAGNOSIS — M542 Cervicalgia: Secondary | ICD-10-CM | POA: Diagnosis not present

## 2023-09-08 MED ORDER — AMPHETAMINE-DEXTROAMPHET ER 25 MG PO CP24
25.0000 mg | ORAL_CAPSULE | Freq: Every day | ORAL | 0 refills | Status: DC
  Filled 2023-09-08: qty 30, 30d supply, fill #0

## 2023-10-13 ENCOUNTER — Other Ambulatory Visit (HOSPITAL_COMMUNITY): Payer: Self-pay

## 2023-10-13 MED ORDER — AMPHETAMINE-DEXTROAMPHET ER 25 MG PO CP24
25.0000 mg | ORAL_CAPSULE | Freq: Every day | ORAL | 0 refills | Status: DC
  Filled 2023-10-13: qty 30, 30d supply, fill #0

## 2023-11-04 DIAGNOSIS — M542 Cervicalgia: Secondary | ICD-10-CM | POA: Diagnosis not present

## 2023-11-04 DIAGNOSIS — G43719 Chronic migraine without aura, intractable, without status migrainosus: Secondary | ICD-10-CM | POA: Diagnosis not present

## 2023-11-07 ENCOUNTER — Other Ambulatory Visit (HOSPITAL_COMMUNITY): Payer: Self-pay

## 2023-11-07 DIAGNOSIS — F419 Anxiety disorder, unspecified: Secondary | ICD-10-CM | POA: Diagnosis not present

## 2023-11-07 DIAGNOSIS — Z79899 Other long term (current) drug therapy: Secondary | ICD-10-CM | POA: Diagnosis not present

## 2023-11-07 DIAGNOSIS — F902 Attention-deficit hyperactivity disorder, combined type: Secondary | ICD-10-CM | POA: Diagnosis not present

## 2023-11-07 MED ORDER — ATOMOXETINE HCL 80 MG PO CAPS
80.0000 mg | ORAL_CAPSULE | Freq: Every day | ORAL | 6 refills | Status: AC
  Filled 2023-11-07: qty 30, 30d supply, fill #0
  Filled 2023-12-05: qty 30, 30d supply, fill #1
  Filled 2024-01-07: qty 30, 30d supply, fill #2
  Filled 2024-02-10: qty 30, 30d supply, fill #3

## 2023-11-07 MED ORDER — AMPHETAMINE-DEXTROAMPHET ER 25 MG PO CP24
25.0000 mg | ORAL_CAPSULE | Freq: Every day | ORAL | 0 refills | Status: DC
  Filled 2023-11-07 – 2023-11-13 (×2): qty 30, 30d supply, fill #0

## 2023-11-13 ENCOUNTER — Other Ambulatory Visit (HOSPITAL_COMMUNITY): Payer: Self-pay

## 2023-12-15 ENCOUNTER — Other Ambulatory Visit (HOSPITAL_COMMUNITY): Payer: Self-pay

## 2023-12-15 MED ORDER — AMPHETAMINE-DEXTROAMPHET ER 25 MG PO CP24
25.0000 mg | ORAL_CAPSULE | Freq: Every day | ORAL | 0 refills | Status: DC
  Filled 2023-12-15: qty 30, 30d supply, fill #0

## 2024-01-05 DIAGNOSIS — H6091 Unspecified otitis externa, right ear: Secondary | ICD-10-CM | POA: Diagnosis not present

## 2024-01-05 DIAGNOSIS — H6691 Otitis media, unspecified, right ear: Secondary | ICD-10-CM | POA: Diagnosis not present

## 2024-01-06 DIAGNOSIS — G43719 Chronic migraine without aura, intractable, without status migrainosus: Secondary | ICD-10-CM | POA: Diagnosis not present

## 2024-01-06 DIAGNOSIS — M542 Cervicalgia: Secondary | ICD-10-CM | POA: Diagnosis not present

## 2024-01-15 ENCOUNTER — Other Ambulatory Visit (HOSPITAL_COMMUNITY): Payer: Self-pay

## 2024-01-15 MED ORDER — AMPHETAMINE-DEXTROAMPHET ER 25 MG PO CP24
25.0000 mg | ORAL_CAPSULE | Freq: Every day | ORAL | 0 refills | Status: DC
  Filled 2024-01-15: qty 30, 30d supply, fill #0

## 2024-01-19 DIAGNOSIS — Z01419 Encounter for gynecological examination (general) (routine) without abnormal findings: Secondary | ICD-10-CM | POA: Diagnosis not present

## 2024-01-19 DIAGNOSIS — R399 Unspecified symptoms and signs involving the genitourinary system: Secondary | ICD-10-CM | POA: Diagnosis not present

## 2024-01-21 DIAGNOSIS — Z713 Dietary counseling and surveillance: Secondary | ICD-10-CM | POA: Diagnosis not present

## 2024-02-16 ENCOUNTER — Other Ambulatory Visit (HOSPITAL_COMMUNITY): Payer: Self-pay

## 2024-02-16 MED ORDER — AMPHETAMINE-DEXTROAMPHET ER 25 MG PO CP24
25.0000 mg | ORAL_CAPSULE | Freq: Every day | ORAL | 0 refills | Status: DC
  Filled 2024-02-16: qty 30, 30d supply, fill #0

## 2024-02-18 DIAGNOSIS — Z23 Encounter for immunization: Secondary | ICD-10-CM | POA: Diagnosis not present

## 2024-02-18 DIAGNOSIS — G43009 Migraine without aura, not intractable, without status migrainosus: Secondary | ICD-10-CM | POA: Diagnosis not present

## 2024-02-18 DIAGNOSIS — Z0000 Encounter for general adult medical examination without abnormal findings: Secondary | ICD-10-CM | POA: Diagnosis not present

## 2024-02-18 DIAGNOSIS — Z6837 Body mass index (BMI) 37.0-37.9, adult: Secondary | ICD-10-CM | POA: Diagnosis not present

## 2024-02-18 DIAGNOSIS — D509 Iron deficiency anemia, unspecified: Secondary | ICD-10-CM | POA: Diagnosis not present

## 2024-02-18 DIAGNOSIS — E669 Obesity, unspecified: Secondary | ICD-10-CM | POA: Diagnosis not present

## 2024-02-18 DIAGNOSIS — R946 Abnormal results of thyroid function studies: Secondary | ICD-10-CM | POA: Diagnosis not present

## 2024-02-18 DIAGNOSIS — F419 Anxiety disorder, unspecified: Secondary | ICD-10-CM | POA: Diagnosis not present

## 2024-02-18 DIAGNOSIS — Z13220 Encounter for screening for lipoid disorders: Secondary | ICD-10-CM | POA: Diagnosis not present

## 2024-02-18 DIAGNOSIS — Z1389 Encounter for screening for other disorder: Secondary | ICD-10-CM | POA: Diagnosis not present

## 2024-02-20 ENCOUNTER — Other Ambulatory Visit (HOSPITAL_COMMUNITY): Payer: Self-pay

## 2024-02-20 DIAGNOSIS — Z713 Dietary counseling and surveillance: Secondary | ICD-10-CM | POA: Diagnosis not present

## 2024-03-04 ENCOUNTER — Other Ambulatory Visit: Payer: Self-pay

## 2024-03-04 ENCOUNTER — Other Ambulatory Visit (HOSPITAL_COMMUNITY): Payer: Self-pay

## 2024-03-04 DIAGNOSIS — F902 Attention-deficit hyperactivity disorder, combined type: Secondary | ICD-10-CM | POA: Diagnosis not present

## 2024-03-04 DIAGNOSIS — Z79899 Other long term (current) drug therapy: Secondary | ICD-10-CM | POA: Diagnosis not present

## 2024-03-04 MED ORDER — AMPHETAMINE-DEXTROAMPHET ER 25 MG PO CP24
25.0000 mg | ORAL_CAPSULE | Freq: Every day | ORAL | 0 refills | Status: DC
  Filled 2024-03-04 – 2024-03-22 (×3): qty 30, 30d supply, fill #0

## 2024-03-04 MED ORDER — ATOMOXETINE HCL 100 MG PO CAPS
100.0000 mg | ORAL_CAPSULE | Freq: Every day | ORAL | 6 refills | Status: AC
  Filled 2024-03-04 – 2024-03-19 (×2): qty 30, 30d supply, fill #0
  Filled 2024-04-22: qty 30, 30d supply, fill #1
  Filled 2024-05-26: qty 30, 30d supply, fill #2

## 2024-03-08 DIAGNOSIS — M542 Cervicalgia: Secondary | ICD-10-CM | POA: Diagnosis not present

## 2024-03-08 DIAGNOSIS — G43719 Chronic migraine without aura, intractable, without status migrainosus: Secondary | ICD-10-CM | POA: Diagnosis not present

## 2024-03-16 ENCOUNTER — Other Ambulatory Visit (HOSPITAL_COMMUNITY): Payer: Self-pay

## 2024-03-19 ENCOUNTER — Other Ambulatory Visit (HOSPITAL_COMMUNITY): Payer: Self-pay

## 2024-03-23 ENCOUNTER — Other Ambulatory Visit (HOSPITAL_COMMUNITY): Payer: Self-pay

## 2024-03-24 DIAGNOSIS — Z713 Dietary counseling and surveillance: Secondary | ICD-10-CM | POA: Diagnosis not present

## 2024-04-21 DIAGNOSIS — Z713 Dietary counseling and surveillance: Secondary | ICD-10-CM | POA: Diagnosis not present

## 2024-04-22 ENCOUNTER — Other Ambulatory Visit (HOSPITAL_COMMUNITY): Payer: Self-pay

## 2024-04-22 ENCOUNTER — Other Ambulatory Visit: Payer: Self-pay

## 2024-04-27 ENCOUNTER — Other Ambulatory Visit (HOSPITAL_COMMUNITY): Payer: Self-pay

## 2024-04-27 MED ORDER — AMPHETAMINE-DEXTROAMPHET ER 25 MG PO CP24
25.0000 mg | ORAL_CAPSULE | Freq: Every day | ORAL | 0 refills | Status: DC
  Filled 2024-04-27: qty 30, 30d supply, fill #0

## 2024-04-29 DIAGNOSIS — E039 Hypothyroidism, unspecified: Secondary | ICD-10-CM | POA: Diagnosis not present

## 2024-04-29 DIAGNOSIS — E8889 Other specified metabolic disorders: Secondary | ICD-10-CM | POA: Diagnosis not present

## 2024-04-29 DIAGNOSIS — Z1331 Encounter for screening for depression: Secondary | ICD-10-CM | POA: Diagnosis not present

## 2024-04-29 DIAGNOSIS — F909 Attention-deficit hyperactivity disorder, unspecified type: Secondary | ICD-10-CM | POA: Diagnosis not present

## 2024-04-29 DIAGNOSIS — F32A Depression, unspecified: Secondary | ICD-10-CM | POA: Diagnosis not present

## 2024-04-29 DIAGNOSIS — G43909 Migraine, unspecified, not intractable, without status migrainosus: Secondary | ICD-10-CM | POA: Diagnosis not present

## 2024-05-26 ENCOUNTER — Encounter: Payer: Self-pay | Admitting: Oncology

## 2024-05-26 ENCOUNTER — Other Ambulatory Visit: Payer: Self-pay

## 2024-05-26 ENCOUNTER — Other Ambulatory Visit (HOSPITAL_COMMUNITY): Payer: Self-pay

## 2024-05-26 MED ORDER — AMPHETAMINE-DEXTROAMPHET ER 25 MG PO CP24
25.0000 mg | ORAL_CAPSULE | Freq: Every day | ORAL | 0 refills | Status: AC
  Filled 2024-05-26: qty 30, 30d supply, fill #0
